# Patient Record
Sex: Male | Born: 1975 | Race: Black or African American | Hispanic: No | Marital: Single | Smoking: Current every day smoker
Health system: Southern US, Community
[De-identification: ages and names within clinical notes are randomized; demographics above are authoritative.]

## PROBLEM LIST (undated history)

## (undated) ENCOUNTER — Emergency Department (HOSPITAL_COMMUNITY): Admission: EM | Payer: Self-pay | Source: Home / Self Care

## (undated) DIAGNOSIS — R569 Unspecified convulsions: Secondary | ICD-10-CM

## (undated) DIAGNOSIS — W3400XA Accidental discharge from unspecified firearms or gun, initial encounter: Secondary | ICD-10-CM

## (undated) DIAGNOSIS — Y249XXA Unspecified firearm discharge, undetermined intent, initial encounter: Secondary | ICD-10-CM

## (undated) HISTORY — PX: ABDOMINAL SURGERY: SHX537

---

## 2013-02-03 ENCOUNTER — Emergency Department (HOSPITAL_COMMUNITY): Payer: Self-pay

## 2013-02-03 ENCOUNTER — Emergency Department (HOSPITAL_COMMUNITY)
Admission: EM | Admit: 2013-02-03 | Discharge: 2013-02-03 | Disposition: A | Discharge status: Home / Self Care | Payer: Self-pay | Attending: Emergency Medicine | Admitting: Emergency Medicine

## 2013-02-03 ENCOUNTER — Encounter (HOSPITAL_COMMUNITY): Payer: Self-pay | Admitting: Emergency Medicine

## 2013-02-03 DIAGNOSIS — R1031 Right lower quadrant pain: Secondary | ICD-10-CM | POA: Insufficient documentation

## 2013-02-03 DIAGNOSIS — F17200 Nicotine dependence, unspecified, uncomplicated: Secondary | ICD-10-CM | POA: Insufficient documentation

## 2013-02-03 DIAGNOSIS — Z9889 Other specified postprocedural states: Secondary | ICD-10-CM | POA: Insufficient documentation

## 2013-02-03 DIAGNOSIS — R109 Unspecified abdominal pain: Secondary | ICD-10-CM

## 2013-02-03 LAB — POCT I-STAT, CHEM 8
BUN: 14 mg/dL (ref 6–23)
Calcium, Ion: 1.22 mmol/L (ref 1.12–1.23)
Chloride: 104 mEq/L (ref 96–112)
Creatinine, Ser: 1 mg/dL (ref 0.50–1.35)
Glucose, Bld: 135 mg/dL — ABNORMAL HIGH (ref 70–99)
HCT: 53 % — ABNORMAL HIGH (ref 39.0–52.0)
Hemoglobin: 18 g/dL — ABNORMAL HIGH (ref 13.0–17.0)
Potassium: 4.6 mEq/L (ref 3.5–5.1)
Sodium: 138 mEq/L (ref 135–145)
TCO2: 28 mmol/L (ref 0–100)

## 2013-02-03 LAB — CBC WITH DIFFERENTIAL/PLATELET
Basophils Absolute: 0 10*3/uL (ref 0.0–0.1)
Basophils Relative: 0 % (ref 0–1)
Eosinophils Absolute: 0.1 10*3/uL (ref 0.0–0.7)
Eosinophils Relative: 1 % (ref 0–5)
HCT: 47.8 % (ref 39.0–52.0)
Hemoglobin: 17.1 g/dL — ABNORMAL HIGH (ref 13.0–17.0)
Lymphocytes Relative: 10 % — ABNORMAL LOW (ref 12–46)
Lymphs Abs: 1.4 10*3/uL (ref 0.7–4.0)
MCH: 29.5 pg (ref 26.0–34.0)
MCHC: 35.8 g/dL (ref 30.0–36.0)
MCV: 82.6 fL (ref 78.0–100.0)
Monocytes Absolute: 0.6 10*3/uL (ref 0.1–1.0)
Monocytes Relative: 4 % (ref 3–12)
Neutro Abs: 13 10*3/uL — ABNORMAL HIGH (ref 1.7–7.7)
Neutrophils Relative %: 86 % — ABNORMAL HIGH (ref 43–77)
Platelets: 221 10*3/uL (ref 150–400)
RBC: 5.79 MIL/uL (ref 4.22–5.81)
RDW: 12.8 % (ref 11.5–15.5)
WBC: 15.2 10*3/uL — ABNORMAL HIGH (ref 4.0–10.5)

## 2013-02-03 LAB — URINALYSIS, ROUTINE W REFLEX MICROSCOPIC
Bilirubin Urine: NEGATIVE
Glucose, UA: NEGATIVE mg/dL
Hgb urine dipstick: NEGATIVE
Ketones, ur: 15 mg/dL — AB
Leukocytes, UA: NEGATIVE
Nitrite: NEGATIVE
Protein, ur: NEGATIVE mg/dL
Specific Gravity, Urine: 1.034 — ABNORMAL HIGH (ref 1.005–1.030)
Urobilinogen, UA: 0.2 mg/dL (ref 0.0–1.0)
pH: 7 (ref 5.0–8.0)

## 2013-02-03 LAB — LIPASE, BLOOD: Lipase: 21 U/L (ref 11–59)

## 2013-02-03 MED ORDER — IOHEXOL 300 MG/ML  SOLN
100.0000 mL | Freq: Once | INTRAMUSCULAR | Status: AC | PRN
  Administered 2013-02-03: 100 mL via INTRAVENOUS

## 2013-02-03 MED ORDER — ONDANSETRON HCL 4 MG/2ML IJ SOLN
4.0000 mg | Freq: Once | INTRAMUSCULAR | Status: AC
  Administered 2013-02-03: 4 mg via INTRAVENOUS
  Filled 2013-02-03: qty 2

## 2013-02-03 MED ORDER — HYDROMORPHONE HCL PF 1 MG/ML IJ SOLN
1.0000 mg | Freq: Once | INTRAMUSCULAR | Status: AC
  Administered 2013-02-03: 1 mg via INTRAVENOUS
  Filled 2013-02-03: qty 1

## 2013-02-03 MED ORDER — IOHEXOL 300 MG/ML  SOLN
50.0000 mL | Freq: Once | INTRAMUSCULAR | Status: AC | PRN
  Administered 2013-02-03: 50 mL via ORAL

## 2013-02-03 MED ORDER — ONDANSETRON HCL 4 MG PO TABS: 4.0000 mg | ORAL_TABLET | Freq: Four times a day (QID) | ORAL | Status: DC

## 2013-02-03 MED ORDER — SODIUM CHLORIDE 0.9 % IV BOLUS (SEPSIS)
1000.0000 mL | Freq: Once | INTRAVENOUS | Status: AC
  Administered 2013-02-03: 1000 mL via INTRAVENOUS

## 2013-02-03 MED ORDER — OXYCODONE-ACETAMINOPHEN 5-325 MG PO TABS: 1.0000 | ORAL_TABLET | ORAL | Status: DC | PRN

## 2013-02-03 NOTE — ED Notes (Signed)
Pt presents to department for evaluation of diffuse abdominal pain and tenderness to RLQ. Onset last night around 9pm. 6/10 pain at the time. Also states nausea/vomiting. Denies urinary symptoms. Last BM yesterday was normal. Pt is alert and oriented x4.
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
## 2013-02-03 NOTE — ED Notes (Signed)
Pt c/o sharp upper abdominal pain onset 2100. Pt also c/o N/V. Last normal Bowel movement was this morning.

## 2013-05-31 ENCOUNTER — Emergency Department (HOSPITAL_COMMUNITY)
Admission: EM | Admit: 2013-05-31 | Discharge: 2013-05-31 | Disposition: A | Discharge status: Home / Self Care | Payer: Self-pay | Attending: Emergency Medicine | Admitting: Emergency Medicine

## 2013-05-31 ENCOUNTER — Encounter (HOSPITAL_COMMUNITY): Payer: Self-pay | Admitting: Family Medicine

## 2013-05-31 DIAGNOSIS — IMO0002 Reserved for concepts with insufficient information to code with codable children: Secondary | ICD-10-CM | POA: Insufficient documentation

## 2013-05-31 DIAGNOSIS — L0291 Cutaneous abscess, unspecified: Secondary | ICD-10-CM

## 2013-05-31 DIAGNOSIS — F17200 Nicotine dependence, unspecified, uncomplicated: Secondary | ICD-10-CM | POA: Insufficient documentation

## 2013-05-31 MED ORDER — SULFAMETHOXAZOLE-TRIMETHOPRIM 800-160 MG PO TABS: 1.0000 | ORAL_TABLET | Freq: Two times a day (BID) | ORAL | Status: DC

## 2013-05-31 MED ORDER — HYDROCODONE-ACETAMINOPHEN 5-325 MG PO TABS: 1.0000 | ORAL_TABLET | ORAL | Status: DC | PRN

## 2013-05-31 NOTE — ED Notes (Signed)
Per pt sts abscess in right axilla area. sts swollen and draining. Hx of same.

## 2013-05-31 NOTE — ED Notes (Addendum)
Pt reports abscess under right arm.  States he has noticed it for a couple months and in the last 3 days the abscess has gotten larger, more red and painful.  Pt alert oriented X4

## 2013-05-31 NOTE — ED Provider Notes (Signed)
   History    CSN: [id number] Arrival date & time 05/31/13  1344  First MD Initiated Contact with Patient 05/31/13 1439     Chief Complaint  Patient presents with  . Abscess   (Consider location/radiation/quality/duration/timing/severity/associated sxs/prior Treatment) HPI  Patient presents to the ED for right axilla x 5 days. Patient states initially it started out as a very small "knot" but has increased in size over the past 2 days and is now very painful. There was initially some purulent drainage but none today.  No recent fevers, sweats, or chills.  Hx of similar abscess requiring I&D.  No medications taken PTA.  History reviewed. No pertinent past medical history. Past Surgical History  Procedure Laterality Date  . Abdominal surgery     History reviewed. No pertinent family history. History  Substance Use Topics  . Smoking status: Current Every Day Smoker -- 0.50 packs/day  . Smokeless tobacco: Not on file  . Alcohol Use: No     Comment: Quit Feb 2014    Review of Systems  Skin:       abscess  All other systems reviewed and are negative.    Allergies  Review of patient's allergies indicates no known allergies.  Home Medications  No current outpatient prescriptions on file. BP 145/92  Pulse 105  Temp(Src) 98.3 F (36.8 C)  Resp 18  SpO2 98%  Physical Exam  Nursing note and vitals reviewed. Constitutional: He is oriented to person, place, and time. He appears well-developed and well-nourished.  HENT:  Head: Normocephalic and atraumatic.  Eyes: Conjunctivae and EOM are normal.  Neck: Normal range of motion. Neck supple.  Cardiovascular: Normal rate, regular rhythm and normal heart sounds.   Pulmonary/Chest: Effort normal and breath sounds normal.  Musculoskeletal: Normal range of motion.  Neurological: He is alert and oriented to person, place, and time.  Skin: Skin is warm and dry.  2 cm abscess of right axilla, nondraining, central fluctuance, no  surrounding erythema, induration, or signs of cellulitis  Psychiatric: He has a normal mood and affect.    ED Course  Procedures (including critical care time)  INCISION AND DRAINAGE Performed by: PA Student Delon Sacramento under my direct supervision. Consent: Verbal consent obtained. Risks and benefits: risks, benefits and alternatives were discussed Type: abscess  Body area: right axilla  Anesthesia: local infiltration  Incision was made with a scalpel.  Local anesthetic: lidocaine 2% without epinephrine  Anesthetic total: 2 ml  Complexity: complex Blunt dissection to break up loculations  Drainage: purulent  Drainage amount: large  Packing material: none  Patient tolerance: Patient tolerated the procedure well with no immediate complications.     Labs Reviewed - No data to display No results found. 1. Abscess     MDM   ID as above, patient tolerated well.  Instructed on home wound care and monitoring for signs of infection that would warrant return to the ED.  Rx Bactrim and Vicodin.  Discussed plan with patient, he agreed. Return precautions advised.  Garlon Hatchet, PA-C 05/31/13 [phone]

## 2013-06-03 NOTE — ED Provider Notes (Signed)
Medical screening examination/treatment/procedure(s) were performed by non-physician practitioner and as supervising physician I was immediately available for consultation/collaboration.  Caelin Rosen R. Anjel Pardo, MD 06/03/13 1502 

## 2014-12-23 ENCOUNTER — Telehealth: Payer: Self-pay | Admitting: *Deleted

## 2014-12-23 ENCOUNTER — Encounter (HOSPITAL_COMMUNITY): Payer: Self-pay | Admitting: Neurology

## 2014-12-23 ENCOUNTER — Emergency Department (HOSPITAL_COMMUNITY)
Admission: EM | Admit: 2014-12-23 | Discharge: 2014-12-23 | Disposition: A | Discharge status: Home / Self Care | Payer: Self-pay | Attending: Emergency Medicine | Admitting: Emergency Medicine

## 2014-12-23 DIAGNOSIS — K611 Rectal abscess: Secondary | ICD-10-CM | POA: Insufficient documentation

## 2014-12-23 DIAGNOSIS — Z720 Tobacco use: Secondary | ICD-10-CM | POA: Insufficient documentation

## 2014-12-23 MED ORDER — HYDROCODONE-ACETAMINOPHEN 5-325 MG PO TABS: 2.0000 | ORAL_TABLET | ORAL | Status: DC | PRN

## 2014-12-23 MED ORDER — CEPHALEXIN 250 MG PO CAPS
500.0000 mg | ORAL_CAPSULE | Freq: Once | ORAL | Status: AC
  Administered 2014-12-23: 500 mg via ORAL
  Filled 2014-12-23: qty 2

## 2014-12-23 MED ORDER — SULFAMETHOXAZOLE-TRIMETHOPRIM 800-160 MG PO TABS: 1.0000 | ORAL_TABLET | Freq: Two times a day (BID) | ORAL | Status: DC

## 2014-12-23 MED ORDER — HYDROCODONE-ACETAMINOPHEN 5-325 MG PO TABS
2.0000 | ORAL_TABLET | Freq: Once | ORAL | Status: AC
  Administered 2014-12-23: 2 via ORAL
  Filled 2014-12-23: qty 2

## 2014-12-23 NOTE — ED Notes (Signed)
Pt has abscess to right inside of buttocks, has been trying to pop but unable.

## 2014-12-23 NOTE — Telephone Encounter (Signed)
Opened by mistake.

## 2014-12-23 NOTE — Progress Notes (Signed)
Christopher Pierron J. Clydene Laming, RN, [place], [place] [phone] ED CM consulted to meet with patient concerning f/u care with PCP and patient does not have insurance. Pt presented to [facility] ED today with back pain.  Met with patient at bedside, confirmed informaton. Pt  reports not having access to f/u care with PCP, or insurance coverage. Discussed with patient importance and benefits of  establishing PCP, and not utilizing the ED for primary care needs. Pt verbalized understanding and is in agreement. Discussed other options, provided list of local  affordable PCPs.  Pt voiced  Interest in the [facility] and [place].  [facility] Brochure given with address, phone number, and the services highlighted. Explained that there is a Customer service manager on site who will assist with [employer] and process. Instructed to call or walk in TODAY before 5:30p to schedule appt for establishing care for PCP. Pt verbalized understanding.   NCM called [facility] to alert them of pt walking over.

## 2014-12-23 NOTE — Discharge Instructions (Signed)
Take bactrim as directed until gone. Take vicodin as needed for pain. Refer to attached documents for more information. Follow up with the recommended general surgeon as needed. Return to the ED with worsening or concerning symptoms.

## 2014-12-23 NOTE — ED Provider Notes (Signed)
CSN: [id number]     Arrival date & time 12/23/14  1150 History  This chart was scribed for non-physician practitioner, Emilia Beck, PA-C, working with Toy Baker, MD by Charline Bills, ED Scribe. This patient was seen in room TR11C/TR11C and the patient's care was started at 1:15 PM.   Chief Complaint  Patient presents with  . Abscess   The history is provided by the patient. No language interpreter was used.   HPI Comments: Christopher Moon is a 39 y.o. male who presents to the Emergency Department complaining of gradually worsening abscess to R buttock first noted 2 days ago. Pain is exacerbated with walking, bending and BMs. Pt reports associated nausea onset earlier today that has resolved. He denies abdominal pain, vomiting. No h/o hemorrhoids.   History reviewed. No pertinent past medical history. Past Surgical History  Procedure Laterality Date  . Abdominal surgery     No family history on file. History  Substance Use Topics  . Smoking status: Current Every Day Smoker -- 0.50 packs/day  . Smokeless tobacco: Not on file  . Alcohol Use: Yes     Comment: Quit Feb 2014    Review of Systems  Gastrointestinal: Negative for vomiting and abdominal pain. Nausea: resolved.  Skin: Positive for wound.       + Abscess  All other systems reviewed and are negative.  Allergies  Review of patient's allergies indicates no known allergies.  Home Medications   Prior to Admission medications   Medication Sig Start Date End Date Taking? Authorizing Provider  HYDROcodone-acetaminophen (NORCO/VICODIN) 5-325 MG per tablet Take 1 tablet by mouth every 4 (four) hours as needed for pain. 05/31/13   Garlon Hatchet, PA-C  sulfamethoxazole-trimethoprim (SEPTRA DS) 800-160 MG per tablet Take 1 tablet by mouth every 12 (twelve) hours. 05/31/13   Garlon Hatchet, PA-C   Triage Vitals: BP 127/83 mmHg  Pulse 90  Temp(Src) 97.9 F (36.6 C) (Oral)  Resp 18  SpO2 98% Physical Exam   Constitutional: He is oriented to person, place, and time. He appears well-developed and well-nourished. No distress.  HENT:  Head: Normocephalic and atraumatic.  Eyes: Conjunctivae and EOM are normal.  Neck: Neck supple.  Cardiovascular: Normal rate and regular rhythm.  Exam reveals no gallop and no friction rub.   No murmur heard. Pulmonary/Chest: Effort normal and breath sounds normal. He has no wheezes. He has no rales. He exhibits no tenderness.  Abdominal: Soft. There is no tenderness.  Genitourinary:  2 x 2 cm perirectal abscess at the 10 o'clock position. The area is tender to palpate with no drainage. Internal rectal exam unremarkable for tracking infection.   Musculoskeletal: Normal range of motion.  Neurological: He is alert and oriented to person, place, and time.  Speech is goal-oriented. Moves limbs without ataxia.   Skin: Skin is warm and dry.  Psychiatric: He has a normal mood and affect. His behavior is normal.  Nursing note and vitals reviewed.  ED Course  Procedures (including critical care time) DIAGNOSTIC STUDIES: Oxygen Saturation is 98% on RA, normal by my interpretation.    COORDINATION OF CARE: 1:18 PM-Discussed treatment plan which includes I&D with pt at bedside and pt agreed to plan.   INCISION AND DRAINAGE PROCEDURE NOTE: Patient identification was confirmed and verbal consent was obtained. This procedure was performed by Emilia Beck, PA-C at 1:30 PM. Site: Perirectal  Sterile procedures observed Anesthetic used (type and amt): topical Blade size: 11 Drainage: copious, purulent Complexity: Complex Site  anesthetized, incision made over site, wound drained and explored loculations, rinsed with copious amounts of normal saline, covered with dry, sterile dressing.  Pt tolerated procedure well without complications.  Instructions for care discussed verbally and pt provided with additional written instructions for homecare and f/u.  Labs  Review Labs Reviewed - No data to display  Imaging Review No results found.   EKG Interpretation None      MDM   Final diagnoses:  Perirectal abscess    1:50 PM Abscess drained without complication. Patient will have antibiotics and pain medication. Patient advised to follow up with General Surgery. Vitals stable and patient afebrile.   I personally performed the services described in this documentation, which was scribed in my presence. The recorded information has been reviewed and is accurate.    Emilia BeckKaitlyn Owyn Raulston, PA-C 12/23/14 1404  Toy BakerAnthony T Allen, MD 12/24/14 1004

## 2015-02-11 ENCOUNTER — Emergency Department (HOSPITAL_COMMUNITY)
Admission: EM | Admit: 2015-02-11 | Discharge: 2015-02-11 | Disposition: A | Discharge status: Home / Self Care | Payer: Self-pay | Attending: Emergency Medicine | Admitting: Emergency Medicine

## 2015-02-11 ENCOUNTER — Encounter (HOSPITAL_COMMUNITY): Payer: Self-pay | Admitting: Family Medicine

## 2015-02-11 DIAGNOSIS — L0390 Cellulitis, unspecified: Secondary | ICD-10-CM

## 2015-02-11 DIAGNOSIS — Z720 Tobacco use: Secondary | ICD-10-CM | POA: Insufficient documentation

## 2015-02-11 DIAGNOSIS — L0291 Cutaneous abscess, unspecified: Secondary | ICD-10-CM

## 2015-02-11 DIAGNOSIS — K611 Rectal abscess: Secondary | ICD-10-CM | POA: Insufficient documentation

## 2015-02-11 MED ORDER — LIDOCAINE-EPINEPHRINE (PF) 2 %-1:200000 IJ SOLN
10.0000 mL | Freq: Once | INTRAMUSCULAR | Status: AC
  Administered 2015-02-11: 10 mL
  Filled 2015-02-11: qty 20

## 2015-02-11 MED ORDER — CEPHALEXIN 500 MG PO CAPS: 500.0000 mg | ORAL_CAPSULE | Freq: Four times a day (QID) | ORAL | Status: DC

## 2015-02-11 MED ORDER — SULFAMETHOXAZOLE-TRIMETHOPRIM 800-160 MG PO TABS: 1.0000 | ORAL_TABLET | Freq: Two times a day (BID) | ORAL | Status: DC

## 2015-02-11 NOTE — ED Notes (Signed)
Pt states possible abscess to buttock area. Reports issues with same in the past. States small raised and swollen area between buttocks, no drainage noted. 6/10 pain upon arrival to ED. No signs of acute distress noted.

## 2015-02-11 NOTE — ED Notes (Signed)
MD and PA at bedside.  

## 2015-02-11 NOTE — ED Provider Notes (Signed)
CSN: [id number]     Arrival date & time 02/11/15  4098 History   First MD Initiated Contact with Patient 02/11/15 [phone]     Chief Complaint  Patient presents with  . Rectal Problems     (Consider location/radiation/quality/duration/timing/severity/associated sxs/prior Treatment) HPI Comments: 39 y.o. male presenting with cc of rectal pain. Pt has PMH of perirectal abscess in January for which he was treated with I&D and Bactrim. Pt states that his pain is similar to the pain he experienced in January. Pt states that his rectal pain worsens with sitting, walking, and BMs. Pt reports having regular BMs with no hematochezia or mucous in the stool. Pt reports normal urinary habits. Pt denies fever, chills, N/V/D, palpitations, or diaphoresis.   The history is provided by the patient.    History reviewed. No pertinent past medical history. Past Surgical History  Procedure Laterality Date  . Abdominal surgery     History reviewed. No pertinent family history. History  Substance Use Topics  . Smoking status: Current Every Day Smoker -- 0.50 packs/day  . Smokeless tobacco: Not on file  . Alcohol Use: Yes     Comment: Quit Feb 2014    Review of Systems  Constitutional: Negative for activity change and appetite change.  Respiratory: Negative for cough and shortness of breath.   Cardiovascular: Negative for chest pain.  Gastrointestinal: Negative for abdominal pain.  Genitourinary: Negative for dysuria.  Skin: Positive for rash.      Allergies  Review of patient's allergies indicates no known allergies.  Home Medications   Prior to Admission medications   Medication Sig Start Date End Date Taking? Authorizing Provider  cephALEXin (KEFLEX) 500 MG capsule Take 1 capsule (500 mg total) by mouth 4 (four) times daily. 02/11/15   Derwood Kaplan, MD  HYDROcodone-acetaminophen (NORCO/VICODIN) 5-325 MG per tablet Take 2 tablets by mouth every 4 (four) hours as needed for moderate pain or  severe pain. Patient not taking: Reported on 02/11/2015 12/23/14   Emilia Beck, PA-C  sulfamethoxazole-trimethoprim (SEPTRA DS) 800-160 MG per tablet Take 1 tablet by mouth 2 (two) times daily. 02/11/15   Geralda Baumgardner Rhunette Croft, MD   BP 110/94 mmHg  Pulse 68  Temp(Src) 98.7 F (37.1 C)  Resp 18  Ht  (1.676 m)  Wt 165 lb (74.844 kg)  BMI 26.64 kg/m2  SpO2 97% Physical Exam  Constitutional: He is oriented to person, place, and time. He appears well-developed.  HENT:  Head: Normocephalic and atraumatic.  Eyes: Conjunctivae and EOM are normal. Pupils are equal, round, and reactive to light.  Neck: Normal range of motion. Neck supple.  Cardiovascular: Normal rate and regular rhythm.   Pulmonary/Chest: Effort normal and breath sounds normal.  Abdominal: Soft. Bowel sounds are normal. He exhibits no distension. There is no tenderness. There is no rebound and no guarding.  Right gluteal fold, lateral to the anal sphincter - there is a 3 cm lesion with fluctuance and surrounding erythema and induration.  Neurological: He is alert and oriented to person, place, and time.  Skin: Skin is warm.  Nursing note and vitals reviewed.   ED Course  Procedures (including critical care time) Labs Review Labs Reviewed - No data to display  Imaging Review No results found.   EKG Interpretation None      INCISION AND DRAINAGE Performed by: Derwood Kaplan Consent: Verbal consent obtained. Risks and benefits: risks, benefits and alternatives were discussed Type: abscess  Body area: Gluteal, perirectal  Anesthesia: local infiltration  Incision was made with a scalpel.  Local anesthetic: lidocaine 1 % with epinephrine  Anesthetic total: 3 ml  Complexity: complex Blunt dissection to break up loculations  Drainage: purulent  Drainage amount: moderate  Packing material: 1/4 in iodoform gauze  Patient tolerance: Patient tolerated the procedure well with no immediate  complications.     MDM   Final diagnoses:  Abscess and cellulitis   Pt comes in with cc of rectal pain. Has a perirectal abscess - no sphincter involvement. Abscess drained. There is surrounding induration and erythema, will start on antibiotics.    Derwood KaplanAnkit Dera Vanaken, MD 02/11/15 [id number]

## 2015-02-11 NOTE — Discharge Instructions (Signed)
We saw you in the ER for the abscess. We drained the abscess, and have packed the wound. Keep the packing in there for 2 days, and see a primary care doctor or return to the ER for the packing removal and wound recheck.  IF YOU HAVE HAD HISTORY OF SUCH INFECTIONS AND FEEL THAT THE WOUND IS HEALING WELL, YOU MAY TAKE OUT THE PACKING YOURSELF AFTER 2-3 DYS IF THERE IS NO MORE DRAINAGE.  Keep the wound clean and dry otherwise, and allow for any drainage to occur.   Abscess An abscess is an infected area that contains a collection of pus and debris.It can occur in almost any part of the body. An abscess is also known as a furuncle or boil. CAUSES  An abscess occurs when tissue gets infected. This can occur from blockage of oil or sweat glands, infection of hair follicles, or a minor injury to the skin. As the body tries to fight the infection, pus collects in the area and creates pressure under the skin. This pressure causes pain. People with weakened immune systems have difficulty fighting infections and get certain abscesses more often.  SYMPTOMS Usually an abscess develops on the skin and becomes a painful mass that is red, warm, and tender. If the abscess forms under the skin, you may feel a moveable soft area under the skin. Some abscesses break open (rupture) on their own, but most will continue to get worse without care. The infection can spread deeper into the body and eventually into the bloodstream, causing you to feel ill.  DIAGNOSIS  Your caregiver will take your medical history and perform a physical exam. A sample of fluid may also be taken from the abscess to determine what is causing your infection. TREATMENT  Your caregiver may prescribe antibiotic medicines to fight the infection. However, taking antibiotics alone usually does not cure an abscess. Your caregiver may need to make a small cut (incision) in the abscess to drain the pus. In some cases, gauze is packed into the abscess to  reduce pain and to continue draining the area. HOME CARE INSTRUCTIONS   Only take over-the-counter or prescription medicines for pain, discomfort, or fever as directed by your caregiver.  If you were prescribed antibiotics, take them as directed. Finish them even if you start to feel better.  If gauze is used, follow your caregiver's directions for changing the gauze.  To avoid spreading the infection:  Keep your draining abscess covered with a bandage.  Wash your hands well.  Do not share personal care items, towels, or whirlpools with others.  Avoid skin contact with others.  Keep your skin and clothes clean around the abscess.  Keep all follow-up appointments as directed by your caregiver. SEEK MEDICAL CARE IF:   You have increased pain, swelling, redness, fluid drainage, or bleeding.  You have muscle aches, chills, or a general ill feeling.  You have a fever. MAKE SURE YOU:   Understand these instructions.  Will watch your condition.  Will get help right away if you are not doing well or get worse. Document Released: 08/31/2005 Document Revised: 05/22/2012 Document Reviewed: 02/03/2012 [facility] Patient Information 2015 [place], [place]. This information is not intended to replace advice given to you by your health care provider. Make sure you discuss any questions you have with your health care provider.  Cellulitis Cellulitis is an infection of the skin and the tissue beneath it. The infected area is usually red and tender. Cellulitis occurs most often  in the arms and lower legs.  CAUSES  Cellulitis is caused by bacteria that enter the skin through cracks or cuts in the skin. The most common types of bacteria that cause cellulitis are staphylococci and streptococci. SIGNS AND SYMPTOMS   Redness and warmth.  Swelling.  Tenderness or pain.  Fever. DIAGNOSIS  Your health care provider can usually determine what is wrong based on a physical exam. Blood tests may  also be done. TREATMENT  Treatment usually involves taking an antibiotic medicine. HOME CARE INSTRUCTIONS   Take your antibiotic medicine as directed by your health care provider. Finish the antibiotic even if you start to feel better.  Keep the infected arm or leg elevated to reduce swelling.  Apply a warm cloth to the affected area up to 4 times per day to relieve pain.  Take medicines only as directed by your health care provider.  Keep all follow-up visits as directed by your health care provider. SEEK MEDICAL CARE IF:   You notice red streaks coming from the infected area.  Your red area gets larger or turns dark in color.  Your bone or joint underneath the infected area becomes painful after the skin has healed.  Your infection returns in the same area or another area.  You notice a swollen bump in the infected area.  You develop new symptoms.  You have a fever. SEEK IMMEDIATE MEDICAL CARE IF:   You feel very sleepy.  You develop vomiting or diarrhea.  You have a general ill feeling (malaise) with muscle aches and pains. MAKE SURE YOU:   Understand these instructions.  Will watch your condition.  Will get help right away if you are not doing well or get worse. Document Released: 08/31/2005 Document Revised: 04/07/2014 Document Reviewed: 02/06/2012 [facility] Patient Information 2015 [place], [place]. This information is not intended to replace advice given to you by your health care provider. Make sure you discuss any questions you have with your health care provider.  Incision and Drainage Incision and drainage is a procedure in which a sac-like structure (cystic structure) is opened and drained. The area to be drained usually contains material such as pus, fluid, or blood.  LET YOUR CAREGIVER KNOW ABOUT:   Allergies to medicine.  Medicines taken, including vitamins, herbs, eyedrops, over-the-counter medicines, and creams.  Use of steroids (by mouth or  creams).  Previous problems with anesthetics or numbing medicines.  History of bleeding problems or blood clots.  Previous surgery.  Other health problems, including diabetes and kidney problems.  Possibility of pregnancy, if this applies. RISKS AND COMPLICATIONS  Pain.  Bleeding.  Scarring.  Infection. BEFORE THE PROCEDURE  You may need to have an ultrasound or other imaging tests to see how large or deep your cystic structure is. Blood tests may also be used to determine if you have an infection or how severe the infection is. You may need to have a tetanus shot. PROCEDURE  The affected area is cleaned with a cleaning fluid. The cyst area will then be numbed with a medicine (local anesthetic). A small incision will be made in the cystic structure. A syringe or catheter may be used to drain the contents of the cystic structure, or the contents may be squeezed out. The area will then be flushed with a cleansing solution. After cleansing the area, it is often gently packed with a gauze or another wound dressing. Once it is packed, it will be covered with gauze and tape or some other  type of wound dressing. AFTER THE PROCEDURE   Often, you will be allowed to go home right after the procedure.  You may be given antibiotic medicine to prevent or heal an infection.  If the area was packed with gauze or some other wound dressing, you will likely need to come back in 1 to 2 days to get it removed.  The area should heal in about 14 days. Document Released: 05/17/2001 Document Revised: 05/22/2012 Document Reviewed: 01/16/2012 [facility] Patient Information 2015 [place]. This information is not intended to replace advice given to you by your health care provider. Make sure you discuss any questions you have with your health care provider.

## 2015-02-11 NOTE — ED Notes (Signed)
Pt here for abscess to rectal area. sts hx of same in January. Denies drainage. sts x 2 days.

## 2015-02-13 ENCOUNTER — Ambulatory Visit: Payer: Self-pay

## 2015-07-03 ENCOUNTER — Encounter (HOSPITAL_COMMUNITY): Payer: Self-pay | Admitting: Emergency Medicine

## 2015-07-03 ENCOUNTER — Emergency Department (HOSPITAL_COMMUNITY): Payer: Worker's Compensation

## 2015-07-03 ENCOUNTER — Emergency Department (HOSPITAL_COMMUNITY)
Admission: EM | Admit: 2015-07-03 | Discharge: 2015-07-03 | Disposition: A | Discharge status: Home / Self Care | Payer: Worker's Compensation | Attending: Emergency Medicine | Admitting: Emergency Medicine

## 2015-07-03 DIAGNOSIS — Y9389 Activity, other specified: Secondary | ICD-10-CM | POA: Insufficient documentation

## 2015-07-03 DIAGNOSIS — Y92000 Kitchen of unspecified non-institutional (private) residence as  the place of occurrence of the external cause: Secondary | ICD-10-CM | POA: Diagnosis not present

## 2015-07-03 DIAGNOSIS — Z720 Tobacco use: Secondary | ICD-10-CM | POA: Diagnosis not present

## 2015-07-03 DIAGNOSIS — Z23 Encounter for immunization: Secondary | ICD-10-CM | POA: Insufficient documentation

## 2015-07-03 DIAGNOSIS — Z792 Long term (current) use of antibiotics: Secondary | ICD-10-CM | POA: Diagnosis not present

## 2015-07-03 DIAGNOSIS — S61317A Laceration without foreign body of left little finger with damage to nail, initial encounter: Secondary | ICD-10-CM | POA: Diagnosis not present

## 2015-07-03 DIAGNOSIS — W260XXA Contact with knife, initial encounter: Secondary | ICD-10-CM | POA: Insufficient documentation

## 2015-07-03 DIAGNOSIS — IMO0002 Reserved for concepts with insufficient information to code with codable children: Secondary | ICD-10-CM

## 2015-07-03 DIAGNOSIS — S61219A Laceration without foreign body of unspecified finger without damage to nail, initial encounter: Secondary | ICD-10-CM

## 2015-07-03 DIAGNOSIS — Y998 Other external cause status: Secondary | ICD-10-CM | POA: Diagnosis not present

## 2015-07-03 DIAGNOSIS — S6992XA Unspecified injury of left wrist, hand and finger(s), initial encounter: Secondary | ICD-10-CM | POA: Diagnosis present

## 2015-07-03 MED ORDER — TETANUS-DIPHTH-ACELL PERTUSSIS 5-2.5-18.5 LF-MCG/0.5 IM SUSP
0.5000 mL | Freq: Once | INTRAMUSCULAR | Status: AC
  Administered 2015-07-03: 0.5 mL via INTRAMUSCULAR
  Filled 2015-07-03: qty 0.5

## 2015-07-03 MED ORDER — LIDOCAINE HCL 2 % IJ SOLN
10.0000 mL | Freq: Once | INTRAMUSCULAR | Status: AC
  Administered 2015-07-03: 200 mg
  Filled 2015-07-03: qty 20

## 2015-07-03 MED ORDER — MORPHINE SULFATE 4 MG/ML IJ SOLN
4.0000 mg | Freq: Once | INTRAMUSCULAR | Status: AC
  Administered 2015-07-03: 4 mg via INTRAMUSCULAR
  Filled 2015-07-03: qty 1

## 2015-07-03 NOTE — ED Provider Notes (Signed)
CSN: [id number]     Arrival date & time 07/03/15  1904 History  This chart was scribed for non-physician practitioner Elpidio Anis, PA-C working with Zadie Rhine, MD by Lyndel Safe, ED Scribe. This patient was seen in room WTR5/WTR5 and the patient's care was started at 8:02 PM.  Chief Complaint  Patient presents with  . Extremity Laceration    left pinky finger   The history is provided by the patient. No language interpreter was used.   HPI Comments: Christopher Moon is a 39 y.o. male, with no pertinent PMhx, who presents to the Emergency Department complaining of a laceration above the DIP joint of his left, 5th digit that occurred 30 minutes pta. 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 Pt notes the laceration is almost completely through the tip of his finger. He is right handed. Last tetanus injection unknown. Denies fevers.   History reviewed. No pertinent past medical history. Past Surgical History  Procedure Laterality Date  . Abdominal surgery     History reviewed. No pertinent family history. History  Substance Use Topics  . Smoking status: Current Every Day Smoker -- 0.50 packs/day  . Smokeless tobacco: Not on file  . Alcohol Use: Yes     Comment: Quit Feb 2014    Review of Systems  Constitutional: Negative for fever.  Skin: Positive for wound.    Allergies  Review of patient's allergies indicates no known allergies.  Home Medications   Prior to Admission medications   Medication Sig Start Date End Date Taking? Authorizing Provider  cephALEXin (KEFLEX) 500 MG capsule Take 1 capsule (500 mg total) by mouth 4 (four) times daily. 02/11/15   Derwood Kaplan, MD  HYDROcodone-acetaminophen (NORCO/VICODIN) 5-325 MG per tablet Take 2 tablets by mouth every 4 (four) hours as needed for moderate pain or severe pain. Patient not taking: Reported on 02/11/2015 12/23/14   Emilia Beck, PA-C  sulfamethoxazole-trimethoprim (SEPTRA DS)  800-160 MG per tablet Take 1 tablet by mouth 2 (two) times daily. 02/11/15   Ankit Nanavati, MD   BP 135/100 mmHg  Pulse 85  Temp(Src) 98.2 F (36.8 C) (Oral)  Resp 17  SpO2 99% Physical Exam  Constitutional: He appears well-developed and well-nourished. No distress.  HENT:  Head: Normocephalic.  Neck: No JVD present.  Pulmonary/Chest: Effort normal. No respiratory distress.  Musculoskeletal:  Left 5th finger flap laceration to tip involving distal 1/4 nail. No bone exposure.   Neurological: He is alert.  Skin: Skin is warm. No rash noted. No erythema. No pallor.  Psychiatric: He has a normal mood and affect. His behavior is normal.  Nursing note and vitals reviewed.   ED Course  Procedures  DIAGNOSTIC STUDIES: Oxygen Saturation is 99% on RA, normal  by my interpretation.    COORDINATION OF CARE: 8:04 PM Discussed treatment plan which includes to perform laceration repair with pt. Will order morphine 4mg  injection. Pt acknowledges and agrees to plan.  LACERATION REPAIR PROCEDURE NOTE The patient's identification was confirmed and consent was obtained. This procedure was performed by Elpidio Anis, PA-C at 8:40 PM. Site: left 5th finger Sterile procedures observed Anesthetic used (type and amt): Digital block using lidocaine 2% without epinephrine  Suture type/size:4-0 prolene Length:1.5 cm # of Sutures: 3 Technique: simple interrupted Complexitysimple Antibx ointment applied Bacitracin Tetanus ordered Site anesthetized, irrigated with NS, explored without evidence of foreign body, wound well approximated, site covered with dry, sterile dressing.  Patient tolerated procedure  well without complications. Instructions for care discussed verbally and patient provided with additional written instructions for homecare and f/u.  Labs Review Labs Reviewed - No data to display  Imaging Review No results found.   EKG Interpretation None      MDM   Final diagnoses:   Laceration    1. Finger laceration  Uncomplicated flap laceration involving superficial structures only. 3 tacking sutures applied.  I personally performed the services described in this documentation, which was scribed in my presence. The recorded information has been reviewed and is accurate.     Elpidio Anis, PA-C 07/03/15 2053  Zadie Rhine, MD 07/03/15 [phone]

## 2015-07-03 NOTE — ED Notes (Signed)
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 End of finger is almost fully separated from the rest of the finger including the fingernail. Unknown when last tetanus shot was. Finger is actively bleeding. PA aware-in to see patient.

## 2015-07-03 NOTE — Discharge Instructions (Signed)
Sutured Wound Care °Sutures are stitches that can be used to close wounds. Wound care helps prevent pain and infection.  °HOME CARE INSTRUCTIONS  °· Rest and elevate the injured area until all the pain and swelling are gone. °· Only take over-the-counter or prescription medicines for pain, discomfort, or fever as directed by your caregiver. °· After 48 hours, gently wash the area with mild soap and water once a day, or as directed. Rinse off the soap. Pat the area dry with a clean towel. Do not rub the wound. This may cause bleeding. °· Follow your caregiver's instructions for how often to change the bandage (dressing). Stop using a dressing after 2 days or after the wound stops draining. °· If the dressing sticks, moisten it with soapy water and gently remove it. °· Apply ointment on the wound as directed. °· Avoid stretching a sutured wound. °· Drink enough fluids to keep your urine clear or pale yellow. °· Follow up with your caregiver for suture removal as directed. °· Use sunscreen on your wound for the next 3 to 6 months so the scar will not darken. °SEEK IMMEDIATE MEDICAL CARE IF:  °· Your wound becomes red, swollen, hot, or tender. °· You have increasing pain in the wound. °· You have a red streak that extends from the wound. °· There is pus coming from the wound. °· You have a fever. °· You have shaking chills. °· There is a bad smell coming from the wound. °· You have persistent bleeding from the wound. °MAKE SURE YOU:  °· Understand these instructions. °· Will watch your condition. °· Will get help right away if you are not doing well or get worse. °Document Released: 12/29/2004 Document Revised: 02/13/2012 Document Reviewed: 03/27/2011 °[employer]® Patient Information ©2015 [employer]. This information is not intended to replace advice given to you by your health care provider. Make sure you discuss any questions you have with your health care provider. ° °

## 2015-07-10 ENCOUNTER — Encounter (HOSPITAL_COMMUNITY): Payer: Self-pay | Admitting: Emergency Medicine

## 2015-07-10 ENCOUNTER — Emergency Department (HOSPITAL_COMMUNITY)
Admission: EM | Admit: 2015-07-10 | Discharge: 2015-07-10 | Disposition: A | Discharge status: Home / Self Care | Payer: Self-pay | Attending: Emergency Medicine | Admitting: Emergency Medicine

## 2015-07-10 DIAGNOSIS — Z792 Long term (current) use of antibiotics: Secondary | ICD-10-CM | POA: Insufficient documentation

## 2015-07-10 DIAGNOSIS — Z4802 Encounter for removal of sutures: Secondary | ICD-10-CM | POA: Insufficient documentation

## 2015-07-10 DIAGNOSIS — Z720 Tobacco use: Secondary | ICD-10-CM | POA: Insufficient documentation

## 2015-07-10 NOTE — Discharge Instructions (Signed)

## 2015-07-10 NOTE — ED Provider Notes (Signed)
CSN: [id number]     Arrival date & time 07/10/15  1609 History  This chart was scribed for non-physician practitioner, Langston Masker, PA-C working with Mancel Bale, MD, by Jarvis Morgan, ED Scribe. This patient was seen in room WTR8/WTR8 and the patient's care was started at 4:53 PM.    Chief Complaint  Patient presents with  . Suture / Staple Removal    L 5th finger, denies complaints    Patient is a 39 y.o. male presenting with suture removal. The history is provided by the patient. No language interpreter was used.  Suture / Staple Removal This is a new problem. The current episode started more than 1 week ago. The problem occurs rarely. The problem has not changed since onset.Nothing aggravates the symptoms. Nothing relieves the symptoms. He has tried nothing for the symptoms.    HPI Comments: Christopher Moon is a 39 y.o. male who presents to the Emergency Department for a suture removal to left 5th finger. Pt initially injured the site 1 week ago. 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 Pt is right hand dominant. He received a tetanus injection, 7 days ago, when the stiches were placed. 3 sutures were placed at that time. Pt has not been taking any abx. He denies any fevers, chills, nausea, vomiting, redness, drainage, or warmth.    History reviewed. No pertinent past medical history. Past Surgical History  Procedure Laterality Date  . Abdominal surgery     No family history on file. History  Substance Use Topics  . Smoking status: Current Every Day Smoker -- 0.50 packs/day  . Smokeless tobacco: Not on file  . Alcohol Use: Yes     Comment: Quit Feb 2014    Review of Systems  Constitutional: Negative for fever and chills.  Gastrointestinal: Negative for nausea and vomiting.  Musculoskeletal: Negative for joint swelling.  Skin: Positive for wound (3 sutures placed to left 5th finger). Negative for color change.  All other systems reviewed  and are negative.     Allergies  Review of patient's allergies indicates no known allergies.  Home Medications   Prior to Admission medications   Medication Sig Start Date End Date Taking? Authorizing Provider  cephALEXin (KEFLEX) 500 MG capsule Take 1 capsule (500 mg total) by mouth 4 (four) times daily. 02/11/15   Derwood Kaplan, MD  HYDROcodone-acetaminophen (NORCO/VICODIN) 5-325 MG per tablet Take 2 tablets by mouth every 4 (four) hours as needed for moderate pain or severe pain. Patient not taking: Reported on 02/11/2015 12/23/14   Emilia Beck, PA-C  sulfamethoxazole-trimethoprim (SEPTRA DS) 800-160 MG per tablet Take 1 tablet by mouth 2 (two) times daily. 02/11/15   Derwood Kaplan, MD   Triage Vitals: BP 145/92 mmHg  Pulse 103  Temp(Src) 98.7 F (37.1 C) (Oral)  SpO2 97%  Physical Exam  Constitutional: He is oriented to person, place, and time. He appears well-developed and well-nourished. No distress.  HENT:  Head: Normocephalic and atraumatic.  Eyes: Conjunctivae and EOM are normal.  Neck: Neck supple. No tracheal deviation present.  Cardiovascular: Normal rate.   Pulmonary/Chest: Effort normal. No respiratory distress.  Musculoskeletal: Normal range of motion.  Neurological: He is alert and oriented to person, place, and time.  Skin: Skin is warm and dry.  Psychiatric: He has a normal mood and affect. His behavior is normal.  Nursing note and vitals reviewed.   ED Course  Procedures (including critical care time)  DIAGNOSTIC  STUDIES: Oxygen Saturation is 97% on RA, normal by my interpretation.    COORDINATION OF CARE: SUTURE REMOVAL Performed by: Langston Masker, PA-C Consent: Verbal consent obtained. Patient identity confirmed: provided demographic data Time out: Immediately prior to procedure a "time out" was called to verify the correct patient, procedure, equipment, support staff and site/side marked as required. Location: L 5th finger  Wound Appearance:  clean Sutures/Staples Removed: 3 removed Patient tolerance: Patient tolerated the procedure well with no immediate complications.      Labs Review Labs Reviewed - No data to display  Imaging Review No results found.   EKG Interpretation None      MDM   Final diagnoses:  Visit for suture removal    I personally performed the services in this documentation, which was scribed in my presence.  The recorded information has been reviewed and considered.   Barnet Pall.    Lonia Skinner [place], PA-C 07/10/15 1758  Mancel Bale, MD 07/10/15 [phone]

## 2015-07-10 NOTE — ED Notes (Signed)
Pt A+ox4, reports here for suture removal to L 5th finger.  Pt denies complaints.  Skin otherwise PWD.  MAEI.  Ambulatory with steady gait.  Speaking full/clear sentences, rr even/un-lab.  NAD.

## 2016-11-16 ENCOUNTER — Emergency Department (HOSPITAL_COMMUNITY)
Admission: EM | Admit: 2016-11-16 | Discharge: 2016-11-17 | Disposition: A | Discharge status: Home / Self Care | Payer: Self-pay | Attending: Emergency Medicine | Admitting: Emergency Medicine

## 2016-11-16 DIAGNOSIS — G930 Cerebral cysts: Secondary | ICD-10-CM | POA: Insufficient documentation

## 2016-11-16 DIAGNOSIS — F10120 Alcohol abuse with intoxication, uncomplicated: Secondary | ICD-10-CM | POA: Insufficient documentation

## 2016-11-16 DIAGNOSIS — F17200 Nicotine dependence, unspecified, uncomplicated: Secondary | ICD-10-CM | POA: Insufficient documentation

## 2016-11-16 DIAGNOSIS — R569 Unspecified convulsions: Secondary | ICD-10-CM | POA: Insufficient documentation

## 2016-11-16 DIAGNOSIS — F10920 Alcohol use, unspecified with intoxication, uncomplicated: Secondary | ICD-10-CM

## 2016-11-16 DIAGNOSIS — Q046 Congenital cerebral cysts: Secondary | ICD-10-CM

## 2016-11-16 NOTE — ED Triage Notes (Signed)
Pt states that he has been drinking daily for several months and wants help getting sober. Alert and oriented. Denies SI/HI.

## 2016-11-17 ENCOUNTER — Emergency Department (HOSPITAL_COMMUNITY): Payer: Self-pay

## 2016-11-17 LAB — COMPREHENSIVE METABOLIC PANEL
ALBUMIN: 3.7 g/dL (ref 3.5–5.0)
ALT: 36 U/L (ref 17–63)
ANION GAP: 6 (ref 5–15)
AST: 39 U/L (ref 15–41)
Alkaline Phosphatase: 97 U/L (ref 38–126)
BUN: 5 mg/dL — ABNORMAL LOW (ref 6–20)
CHLORIDE: 104 mmol/L (ref 101–111)
CO2: 28 mmol/L (ref 22–32)
Calcium: 8.3 mg/dL — ABNORMAL LOW (ref 8.9–10.3)
Creatinine, Ser: 0.92 mg/dL (ref 0.61–1.24)
GFR calc non Af Amer: >60 mL/min (ref >60)
GLUCOSE: 109 mg/dL — AB (ref 65–99)
Potassium: 3.5 mmol/L (ref 3.5–5.1)
SODIUM: 138 mmol/L (ref 135–145)
Total Bilirubin: 0.7 mg/dL (ref 0.3–1.2)
Total Protein: 7.4 g/dL (ref 6.5–8.1)

## 2016-11-17 LAB — CBC
HCT: 44.1 % (ref 39.0–52.0)
Hemoglobin: 15.5 g/dL (ref 13.0–17.0)
MCH: 29.5 pg (ref 26.0–34.0)
MCHC: 35.1 g/dL (ref 30.0–36.0)
MCV: 84 fL (ref 78.0–100.0)
PLATELETS: 254 10*3/uL (ref 150–400)
RBC: 5.25 MIL/uL (ref 4.22–5.81)
RDW: 13.6 % (ref 11.5–15.5)
WBC: 7 10*3/uL (ref 4.0–10.5)

## 2016-11-17 LAB — ETHANOL: Alcohol, Ethyl (B): 267 mg/dL — ABNORMAL HIGH (ref <5)

## 2016-11-17 MED ORDER — CARBAMAZEPINE 200 MG PO TABS: ORAL_TABLET | ORAL | 0 refills | Status: DC

## 2016-11-17 NOTE — Discharge Instructions (Signed)
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 This is not concerning at this time, but should be followed up with by a specialist. If this increases in size, it may result in persistent headache, dizziness, or excess fluid around your brain. There is no evidence of excess fluid on your brain today. Your blood work is reassuring.  We advise that you try to discontinue drinking alcohol. You may use Tegretol as prescribed to prevent alcohol withdrawal symptoms. Referred to the resource guide for alcohol detox and rehabilitation facilities in the area. You may return to the emergency department for new or concerning symptoms.

## 2016-11-17 NOTE — ED Provider Notes (Signed)
[facility]-EMERGENCY DEPT Provider Note   CSN: [id number] Arrival date & time: 11/16/16  2111  By signing my name below, I, Christopher Moon, attest that this documentation has been prepared under the direction and in the presence of TRW AutomotiveKelly Mishawn Hemann, PA-C. Electronically Signed: Javier Dockerobert Ryan Moon, ER Scribe. 07/16/2016. 1:13 AM.    History   Chief Complaint Chief Complaint  Patient presents with  . Alcohol Problem    HPI  HPI Comments: Christopher Moon is a 40 y.o. male who presents to the Emergency Department complaining of alcohol abuse. He last drank today. He drank four 24 oz bottles of beer and one fifth of liquor, which is normal for him. He also reports using MDMA over the past month and smoking mariajuana today. He states he was seen having seizure like activity earlier today and that is why he reported to the ED. He denies SI or HI. He denies past family hx of seizures. He states he has not eaten anything in several days.    No past medical history on file.  There are no active problems to display for this patient.   Past Surgical History:  Procedure Laterality Date  . ABDOMINAL SURGERY       Home Medications    Prior to Admission medications   Medication Sig Start Date End Date Taking? Authorizing Provider  carbamazepine (TEGRETOL) 200 MG tablet 800mg  PO QD X 1D, then 600mg  PO QD X 1D, then 400mg  QD X 1D, then 200mg  PO QD X 2D Take for alcohol withdrawal symptoms 11/17/16   Antony MaduraKelly Madison Albea, PA-C  cephALEXin (KEFLEX) 500 MG capsule Take 1 capsule (500 mg total) by mouth 4 (four) times daily. Patient not taking: Reported on 11/17/2016 02/11/15   Derwood KaplanAnkit Nanavati, MD  HYDROcodone-acetaminophen (NORCO/VICODIN) 5-325 MG per tablet Take 2 tablets by mouth every 4 (four) hours as needed for moderate pain or severe pain. Patient not taking: Reported on 02/11/2015 12/23/14   Emilia BeckKaitlyn Szekalski, PA-C  sulfamethoxazole-trimethoprim (SEPTRA DS) 800-160 MG per tablet Take 1 tablet by mouth 2 (two)  times daily. Patient not taking: Reported on 11/17/2016 02/11/15   Derwood KaplanAnkit Nanavati, MD    Family History No family history on file.  Social History Social History  Substance Use Topics  . Smoking status: Current Every Day Smoker    Packs/day: 0.50  . Smokeless tobacco: Not on file  . Alcohol use Yes     Comment: Quit Feb 2014     Allergies   Patient has no known allergies.   Review of Systems Review of Systems A complete 10 system review of systems was obtained and all systems are negative except as noted in the HPI and PMH.    Physical Exam Updated Vital Signs BP 100/67 (BP Location: Right Arm)   Pulse 70   Temp 97.7 F (36.5 C) (Oral)   Resp 20   SpO2 93%   Physical Exam  Constitutional: He is oriented to person, place, and time. He appears well-developed and well-nourished. No distress.  Calm and cooperative. Disheveled.  HENT:  Head: Normocephalic and atraumatic.  Mouth/Throat: Oropharynx is clear and moist.  Eyes: EOM are normal. Pupils are equal, round, and reactive to light. No scleral icterus.  Mild conjunctival injection bilaterally  Neck: Normal range of motion.  No nuchal rigidity or meningismus  Cardiovascular: Normal rate, regular rhythm and intact distal pulses.   Pulmonary/Chest: Effort normal. No respiratory distress. He has no wheezes. He has no rales.  Respirations even and unlabored  Musculoskeletal:  Normal range of motion.  Neurological: He is alert and oriented to person, place, and time. No cranial nerve deficit. He exhibits normal muscle tone. Coordination normal.  GCS 15. Speech is goal oriented. Patient answers questions appropriately and follows commands. He is ambulatory with steady gait.  Skin: Skin is warm and dry. No rash noted. He is not diaphoretic. No erythema. No pallor.  Psychiatric: He has a normal mood and affect. His behavior is normal.  Nursing note and vitals reviewed.    ED Treatments / Results  Labs (all labs ordered  are listed, but only abnormal results are displayed) Labs Reviewed  COMPREHENSIVE METABOLIC PANEL - Abnormal; Notable for the following:       Result Value   Glucose, Bld 109 (*)    BUN 5 (*)    Calcium 8.3 (*)    All other components within normal limits  ETHANOL - Abnormal; Notable for the following:    Alcohol, Ethyl (B) 267 (*)    All other components within normal limits  CBC    ED ECG REPORT   Date: 11/17/2016  Rate: 77  Rhythm: normal sinus rhythm  QRS Axis: normal  Intervals: normal  ST/T Wave abnormalities: normal  Conduction Disutrbances:none  Narrative Interpretation: NSR; no STEMI  Old EKG Reviewed: none available  I have personally reviewed the EKG tracing and agree with the computerized printout as noted.   Radiology Ct Head Wo Contrast  Result Date: 11/17/2016 CLINICAL DATA:  40 y/o M; seizure 11/16/2016, pain on top of head, unsure of injury. EXAM: CT HEAD WITHOUT CONTRAST TECHNIQUE: Contiguous axial images were obtained from the base of the skull through the vertex without intravenous contrast. COMPARISON:  None. FINDINGS: Brain: No evidence of acute infarction, hemorrhage, hydrocephalus, or extra-axial collection. Round hyperdense nodule measuring 7 mm in the midline at foramen of Monro. Vascular: No hyperdense vessel or unexpected calcification. Skull: Normal. Negative for fracture or focal lesion. Sinuses/Orbits: Opacification of right frontal sinus antrum in anterior ethmoid air cells. Otherwise visualized paranasal sinuses and mastoid air cells are normally aerated. Orbits are unremarkable. Other: None. IMPRESSION: 1. No acute intracranial abnormality identified. 2. Incidental round hyperdense nodule measuring 7 mm in the midline at foramen of Monro, typical of colloid cyst. No hydrocephalus at this time. Further characterization with MRI of the brain with contrast is recommended. These results were called by telephone at the time of interpretation on 11/17/2016  at 3:02 am to Dr. Antony MaduraKELLY Kavish Lafitte , who verbally acknowledged these results. Electronically Signed   By: Mitzi HansenLance  Furusawa-Stratton M.D.   On: 11/17/2016 03:02    Procedures Procedures (including critical care time)  Medications Ordered in ED Medications - No data to display   Initial Impression / Assessment and Plan / ED Course  I have reviewed the triage vital signs and the nursing notes.  Pertinent labs & imaging results that were available during my care of the patient were reviewed by me and considered in my medical decision making (see chart for details).  Clinical Course     40 year old male comes to the emergency department for reported seizure-like activity. He does not recall this event and reports having a fifth of liquor and multiple beers prior to arrival. Shane CrutchStory is not consistent with seizure from alcohol withdrawal as patient acutely intoxicated on arrival. He has been monitored in the emergency department for over 7 hours without any repeat seizure activity. He has no personal or family history of seizures. Laboratory workup is reassuring. There  is no leukocytosis.  CT head performed given reported seizure-like activity. This reveals an incidental 7 mm Colloid cyst without evidence of hydrocephalus. It is unlikely for this to be a complicating feature of patient's symptoms today. Question legitimacy of seizure activity. This also may have been provoked by substance use as he reports mixing ETOH with MDMA and marijuana today. He has a nonfocal neurologic exam and has remained stable throughout ED observation. I believe he is appropriate for outpatient follow-up with neurosurgery.   Patient states that he does desire to stop drinking. Prescription provided for Tegretol taper. The patient has been instructed not to take this medication with alcohol, should he not be able to successfully detox. Resource guide provided should he choose to seek out alcohol rehabilitation facilities. I do not  believe further emergent workup is indicated at this time. Patient discharged in stable condition with no unaddressed concerns.   Vitals:   11/16/16 2145 11/17/16 0249 11/17/16 0521 11/17/16 0526  BP: 116/89 105/66 100/67 100/67  Pulse: 112 74 70 70  Resp: 18 18  20   Temp: 98.1 F (36.7 C) 97.6 F (36.4 C)  97.7 F (36.5 C)  TempSrc: Oral Oral  Oral  SpO2: 97% 98%  93%    Final Clinical Impressions(s) / ED Diagnoses   Final diagnoses:  Colloid cyst of brain (HCC)  Alcoholic intoxication without complication (HCC)  Observed seizure-like activity (HCC)    New Prescriptions New Prescriptions   CARBAMAZEPINE (TEGRETOL) 200 MG TABLET    800mg  PO QD X 1D, then 600mg  PO QD X 1D, then 400mg  QD X 1D, then 200mg  PO QD X 2D Take for alcohol withdrawal symptoms    I personally performed the services described in this documentation, which was scribed in my presence. The recorded information has been reviewed and is accurate.       Antony Madura, PA-C 11/17/16 0715    Antony Madura, PA-C 11/17/16 1610    Arby Barrette, MD 11/17/16 [phone]

## 2017-03-31 ENCOUNTER — Encounter (HOSPITAL_COMMUNITY): Payer: Self-pay

## 2017-03-31 ENCOUNTER — Emergency Department (HOSPITAL_COMMUNITY): Payer: Self-pay

## 2017-03-31 DIAGNOSIS — R112 Nausea with vomiting, unspecified: Secondary | ICD-10-CM | POA: Insufficient documentation

## 2017-03-31 DIAGNOSIS — F17200 Nicotine dependence, unspecified, uncomplicated: Secondary | ICD-10-CM | POA: Insufficient documentation

## 2017-03-31 DIAGNOSIS — Z79899 Other long term (current) drug therapy: Secondary | ICD-10-CM | POA: Insufficient documentation

## 2017-03-31 DIAGNOSIS — R1084 Generalized abdominal pain: Secondary | ICD-10-CM | POA: Insufficient documentation

## 2017-03-31 LAB — URINALYSIS, ROUTINE W REFLEX MICROSCOPIC
BACTERIA UA: NONE SEEN
Bilirubin Urine: NEGATIVE
Glucose, UA: NEGATIVE mg/dL
Ketones, ur: NEGATIVE mg/dL
Leukocytes, UA: NEGATIVE
NITRITE: NEGATIVE
Protein, ur: NEGATIVE mg/dL
SPECIFIC GRAVITY, URINE: 1.027 (ref 1.005–1.030)
SQUAMOUS EPITHELIAL / LPF: NONE SEEN
pH: 5 (ref 5.0–8.0)

## 2017-03-31 LAB — COMPREHENSIVE METABOLIC PANEL
ALBUMIN: 3.9 g/dL (ref 3.5–5.0)
ALK PHOS: 85 U/L (ref 38–126)
ALT: 37 U/L (ref 17–63)
AST: 36 U/L (ref 15–41)
Anion gap: 10 (ref 5–15)
BILIRUBIN TOTAL: 0.6 mg/dL (ref 0.3–1.2)
BUN: 10 mg/dL (ref 6–20)
CO2: 23 mmol/L (ref 22–32)
CREATININE: 1.07 mg/dL (ref 0.61–1.24)
Calcium: 8.9 mg/dL (ref 8.9–10.3)
Chloride: 101 mmol/L (ref 101–111)
GFR calc Af Amer: >60 mL/min (ref >60)
GFR calc non Af Amer: >60 mL/min (ref >60)
GLUCOSE: 131 mg/dL — AB (ref 65–99)
POTASSIUM: 3.9 mmol/L (ref 3.5–5.1)
Sodium: 134 mmol/L — ABNORMAL LOW (ref 135–145)
TOTAL PROTEIN: 7.3 g/dL (ref 6.5–8.1)

## 2017-03-31 LAB — CBC
HEMATOCRIT: 44.7 % (ref 39.0–52.0)
Hemoglobin: 15.2 g/dL (ref 13.0–17.0)
MCH: 29 pg (ref 26.0–34.0)
MCHC: 34 g/dL (ref 30.0–36.0)
MCV: 85.3 fL (ref 78.0–100.0)
Platelets: 272 10*3/uL (ref 150–400)
RBC: 5.24 MIL/uL (ref 4.22–5.81)
RDW: 13.8 % (ref 11.5–15.5)
WBC: 12.4 10*3/uL — ABNORMAL HIGH (ref 4.0–10.5)

## 2017-03-31 LAB — LIPASE, BLOOD: Lipase: 22 U/L (ref 11–51)

## 2017-03-31 MED ORDER — ONDANSETRON 4 MG PO TBDP
4.0000 mg | ORAL_TABLET | Freq: Once | ORAL | Status: AC
  Administered 2017-03-31: 4 mg via ORAL

## 2017-03-31 MED ORDER — ONDANSETRON 4 MG PO TBDP
ORAL_TABLET | ORAL | Status: AC
  Filled 2017-03-31: qty 1

## 2017-03-31 NOTE — ED Triage Notes (Signed)
Pt complaining of N/V after dinner. Pt states emesis x 4 today. Pt also complaining of SOB. Pt sats = 100% on RA. VSS.

## 2017-04-01 ENCOUNTER — Emergency Department (HOSPITAL_COMMUNITY): Payer: Self-pay

## 2017-04-01 ENCOUNTER — Encounter (HOSPITAL_COMMUNITY): Payer: Self-pay | Admitting: *Deleted

## 2017-04-01 ENCOUNTER — Emergency Department (HOSPITAL_COMMUNITY)
Admission: EM | Admit: 2017-04-01 | Discharge: 2017-04-01 | Disposition: A | Discharge status: Home / Self Care | Payer: Self-pay | Attending: Emergency Medicine | Admitting: Emergency Medicine

## 2017-04-01 DIAGNOSIS — R112 Nausea with vomiting, unspecified: Secondary | ICD-10-CM

## 2017-04-01 DIAGNOSIS — R1084 Generalized abdominal pain: Secondary | ICD-10-CM

## 2017-04-01 MED ORDER — ONDANSETRON 4 MG PO TBDP: 4.0000 mg | ORAL_TABLET | Freq: Three times a day (TID) | ORAL | 0 refills | Status: DC | PRN

## 2017-04-01 MED ORDER — IOPAMIDOL (ISOVUE-300) INJECTION 61%
INTRAVENOUS | Status: AC
  Administered 2017-04-01: 100 mL
  Filled 2017-04-01: qty 100
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
## 2017-04-01 NOTE — ED Notes (Signed)
Patient transported to X-ray 

## 2017-04-01 NOTE — ED Notes (Signed)
Pt given gingerale for fluid challenge. Pt reporting no nausea or vomiting. No more abd pain. Pt tolerating fluids well. Will continue to monitor.

## 2017-04-01 NOTE — ED Notes (Signed)
Pt is in stable condition upon d/c and ambulates from ED. 

## 2017-04-01 NOTE — ED Notes (Signed)
Pt to CT

## 2017-04-01 NOTE — Discharge Instructions (Signed)
As discussed, slowly reintroduce foods starting with fluids and broths. Follow up with your primary care provider.  Monitor for any worsening and return to ED if pain worsens, fever, chills, unable to eat or drink.
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
## 2017-04-02 ENCOUNTER — Emergency Department (HOSPITAL_COMMUNITY)
Admission: EM | Admit: 2017-04-02 | Discharge: 2017-04-03 | Disposition: A | Discharge status: Home / Self Care | Payer: PRIVATE HEALTH INSURANCE | Attending: Emergency Medicine | Admitting: Emergency Medicine

## 2017-04-02 ENCOUNTER — Encounter (HOSPITAL_COMMUNITY): Payer: Self-pay | Admitting: Emergency Medicine

## 2017-04-02 DIAGNOSIS — R197 Diarrhea, unspecified: Secondary | ICD-10-CM | POA: Insufficient documentation

## 2017-04-02 DIAGNOSIS — R112 Nausea with vomiting, unspecified: Secondary | ICD-10-CM | POA: Insufficient documentation

## 2017-04-02 DIAGNOSIS — F17200 Nicotine dependence, unspecified, uncomplicated: Secondary | ICD-10-CM | POA: Insufficient documentation

## 2017-04-02 NOTE — ED Triage Notes (Signed)
Pt presents with n /v/d x 2 days. Emesis x 2 today with diarrhea x 3. Pt states he was seen for same Friday night.

## 2017-04-03 LAB — I-STAT CHEM 8, ED
BUN: 7 mg/dL (ref 6–20)
CALCIUM ION: 1.09 mmol/L — AB (ref 1.15–1.40)
CHLORIDE: 99 mmol/L — AB (ref 101–111)
Creatinine, Ser: 1.2 mg/dL (ref 0.61–1.24)
Glucose, Bld: 106 mg/dL — ABNORMAL HIGH (ref 65–99)
HEMATOCRIT: 43 % (ref 39.0–52.0)
Hemoglobin: 14.6 g/dL (ref 13.0–17.0)
POTASSIUM: 3.6 mmol/L (ref 3.5–5.1)
SODIUM: 137 mmol/L (ref 135–145)
TCO2: 26 mmol/L (ref 0–100)

## 2017-04-03 MED ORDER — SODIUM CHLORIDE 0.9 % IV BOLUS (SEPSIS)
1000.0000 mL | Freq: Once | INTRAVENOUS | Status: AC
  Administered 2017-04-03: 1000 mL via INTRAVENOUS

## 2017-04-03 MED ORDER — ONDANSETRON HCL 4 MG/2ML IJ SOLN
4.0000 mg | Freq: Once | INTRAMUSCULAR | Status: AC
  Administered 2017-04-03: 4 mg via INTRAVENOUS
  Filled 2017-04-03: qty 2

## 2017-04-03 MED ORDER — ONDANSETRON 4 MG PO TBDP: 4.0000 mg | ORAL_TABLET | Freq: Once | ORAL | Status: DC

## 2017-04-03 NOTE — Discharge Instructions (Signed)
Read the information below.  You may return to the Emergency Department at any time for worsening condition or any new symptoms that concern you.  If you develop high fevers, worsening abdominal pain, uncontrolled vomiting, or are unable to tolerate fluids by mouth, return to the ER for a recheck.   °

## 2017-04-03 NOTE — ED Notes (Signed)
Pt reports he was able to keep soda down
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
## 2017-05-08 ENCOUNTER — Encounter (HOSPITAL_COMMUNITY): Payer: Self-pay | Admitting: Emergency Medicine

## 2017-05-08 ENCOUNTER — Emergency Department (HOSPITAL_COMMUNITY)
Admission: EM | Admit: 2017-05-08 | Discharge: 2017-05-08 | Disposition: A | Discharge status: Home / Self Care | Payer: PRIVATE HEALTH INSURANCE | Attending: Emergency Medicine | Admitting: Emergency Medicine

## 2017-05-08 DIAGNOSIS — R197 Diarrhea, unspecified: Secondary | ICD-10-CM | POA: Insufficient documentation

## 2017-05-08 DIAGNOSIS — R112 Nausea with vomiting, unspecified: Secondary | ICD-10-CM | POA: Insufficient documentation

## 2017-05-08 DIAGNOSIS — Z79899 Other long term (current) drug therapy: Secondary | ICD-10-CM | POA: Insufficient documentation

## 2017-05-08 DIAGNOSIS — F17200 Nicotine dependence, unspecified, uncomplicated: Secondary | ICD-10-CM | POA: Insufficient documentation

## 2017-05-08 LAB — CBC WITH DIFFERENTIAL/PLATELET
BASOS ABS: 0 10*3/uL (ref 0.0–0.1)
BASOS PCT: 0 %
EOS PCT: 1 %
Eosinophils Absolute: 0.1 10*3/uL (ref 0.0–0.7)
HCT: 43 % (ref 39.0–52.0)
Hemoglobin: 14.8 g/dL (ref 13.0–17.0)
LYMPHS PCT: 8 %
Lymphs Abs: 0.6 10*3/uL — ABNORMAL LOW (ref 0.7–4.0)
MCH: 29.7 pg (ref 26.0–34.0)
MCHC: 34.4 g/dL (ref 30.0–36.0)
MCV: 86.2 fL (ref 78.0–100.0)
Monocytes Absolute: 0.5 10*3/uL (ref 0.1–1.0)
Monocytes Relative: 6 %
NEUTROS ABS: 7 10*3/uL (ref 1.7–7.7)
Neutrophils Relative %: 85 %
PLATELETS: 197 10*3/uL (ref 150–400)
RBC: 4.99 MIL/uL (ref 4.22–5.81)
RDW: 13.5 % (ref 11.5–15.5)
WBC: 8.2 10*3/uL (ref 4.0–10.5)

## 2017-05-08 LAB — COMPREHENSIVE METABOLIC PANEL
ALBUMIN: 3.6 g/dL (ref 3.5–5.0)
ALT: 77 U/L — ABNORMAL HIGH (ref 17–63)
AST: 83 U/L — ABNORMAL HIGH (ref 15–41)
Alkaline Phosphatase: 100 U/L (ref 38–126)
Anion gap: 8 (ref 5–15)
BUN: 8 mg/dL (ref 6–20)
CHLORIDE: 101 mmol/L (ref 101–111)
CO2: 25 mmol/L (ref 22–32)
Calcium: 8.8 mg/dL — ABNORMAL LOW (ref 8.9–10.3)
Creatinine, Ser: 1.01 mg/dL (ref 0.61–1.24)
GFR calc Af Amer: >60 mL/min (ref >60)
GLUCOSE: 120 mg/dL — AB (ref 65–99)
POTASSIUM: 4.2 mmol/L (ref 3.5–5.1)
Sodium: 134 mmol/L — ABNORMAL LOW (ref 135–145)
Total Bilirubin: 2 mg/dL — ABNORMAL HIGH (ref 0.3–1.2)
Total Protein: 7.3 g/dL (ref 6.5–8.1)

## 2017-05-08 LAB — LIPASE, BLOOD: LIPASE: 25 U/L (ref 11–51)

## 2017-05-08 MED ORDER — PROMETHAZINE HCL 25 MG PO TABS: 25.0000 mg | ORAL_TABLET | Freq: Four times a day (QID) | ORAL | 0 refills | Status: DC | PRN

## 2017-05-08 MED ORDER — ONDANSETRON HCL 4 MG/2ML IJ SOLN
4.0000 mg | Freq: Once | INTRAMUSCULAR | Status: AC
  Administered 2017-05-08: 4 mg via INTRAVENOUS
  Filled 2017-05-08: qty 2

## 2017-05-08 MED ORDER — SODIUM CHLORIDE 0.9 % IV BOLUS (SEPSIS)
1000.0000 mL | Freq: Once | INTRAVENOUS | Status: AC
  Administered 2017-05-08: 1000 mL via INTRAVENOUS
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
## 2017-05-08 NOTE — ED Notes (Signed)
Pt tolerating PO fluids. Dr. Adriana Simasook notified.

## 2017-05-08 NOTE — ED Triage Notes (Signed)
Pt reports having vomiting and diarrhea for the last 24 hr and reports to being an alcoholic and last drink was at 1200 on 05/07/17. Pt reports 7 plus episodes of vomiting.

## 2017-05-08 NOTE — Discharge Instructions (Signed)
Increase fluids. Prescription for nausea medicine.  Return if worse.

## 2017-05-09 ENCOUNTER — Encounter (HOSPITAL_COMMUNITY): Payer: Self-pay | Admitting: Emergency Medicine

## 2017-05-09 ENCOUNTER — Emergency Department (HOSPITAL_COMMUNITY)
Admission: EM | Admit: 2017-05-09 | Discharge: 2017-05-09 | Disposition: A | Discharge status: Home / Self Care | Payer: PRIVATE HEALTH INSURANCE | Attending: Emergency Medicine | Admitting: Emergency Medicine

## 2017-05-09 DIAGNOSIS — F17200 Nicotine dependence, unspecified, uncomplicated: Secondary | ICD-10-CM | POA: Insufficient documentation

## 2017-05-09 DIAGNOSIS — Z79899 Other long term (current) drug therapy: Secondary | ICD-10-CM | POA: Insufficient documentation

## 2017-05-09 DIAGNOSIS — R197 Diarrhea, unspecified: Secondary | ICD-10-CM

## 2017-05-09 HISTORY — DX: Accidental discharge from unspecified firearms or gun, initial encounter: W34.00XA

## 2017-05-09 HISTORY — DX: Unspecified firearm discharge, undetermined intent, initial encounter: Y24.9XXA

## 2017-05-09 NOTE — ED Triage Notes (Signed)
PT states he was seen here last night for nausea, vomiting, and diarrhea  Pt states today he eats and only has diarrhea but when he eats it goes straight through him  Pt states he feels hungry
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
## 2017-05-20 ENCOUNTER — Encounter (HOSPITAL_COMMUNITY): Payer: Self-pay | Admitting: Emergency Medicine

## 2017-05-20 ENCOUNTER — Emergency Department (HOSPITAL_COMMUNITY)
Admission: EM | Admit: 2017-05-20 | Discharge: 2017-05-20 | Disposition: A | Discharge status: Home / Self Care | Payer: PRIVATE HEALTH INSURANCE | Attending: Emergency Medicine | Admitting: Emergency Medicine

## 2017-05-20 DIAGNOSIS — F14182 Cocaine abuse with cocaine-induced sleep disorder: Secondary | ICD-10-CM | POA: Insufficient documentation

## 2017-05-20 DIAGNOSIS — F1910 Other psychoactive substance abuse, uncomplicated: Secondary | ICD-10-CM

## 2017-05-20 DIAGNOSIS — F19982 Other psychoactive substance use, unspecified with psychoactive substance-induced sleep disorder: Secondary | ICD-10-CM

## 2017-05-20 DIAGNOSIS — Z79899 Other long term (current) drug therapy: Secondary | ICD-10-CM | POA: Insufficient documentation

## 2017-05-20 DIAGNOSIS — F1210 Cannabis abuse, uncomplicated: Secondary | ICD-10-CM | POA: Insufficient documentation

## 2017-05-20 LAB — RAPID URINE DRUG SCREEN, HOSP PERFORMED
Amphetamines: NOT DETECTED
BARBITURATES: NOT DETECTED
BENZODIAZEPINES: NOT DETECTED
COCAINE: POSITIVE — AB
OPIATES: NOT DETECTED
TETRAHYDROCANNABINOL: POSITIVE — AB

## 2017-05-20 LAB — COMPREHENSIVE METABOLIC PANEL
ALT: 90 U/L — ABNORMAL HIGH (ref 17–63)
AST: 99 U/L — ABNORMAL HIGH (ref 15–41)
Albumin: 3.9 g/dL (ref 3.5–5.0)
Alkaline Phosphatase: 113 U/L (ref 38–126)
Anion gap: 9 (ref 5–15)
BILIRUBIN TOTAL: 2.6 mg/dL — AB (ref 0.3–1.2)
BUN: 8 mg/dL (ref 6–20)
CHLORIDE: 99 mmol/L — AB (ref 101–111)
CO2: 25 mmol/L (ref 22–32)
Calcium: 9.4 mg/dL (ref 8.9–10.3)
Creatinine, Ser: 1.03 mg/dL (ref 0.61–1.24)
Glucose, Bld: 113 mg/dL — ABNORMAL HIGH (ref 65–99)
POTASSIUM: 3.6 mmol/L (ref 3.5–5.1)
Sodium: 133 mmol/L — ABNORMAL LOW (ref 135–145)
TOTAL PROTEIN: 7.5 g/dL (ref 6.5–8.1)

## 2017-05-20 LAB — CBC
HEMATOCRIT: 47.1 % (ref 39.0–52.0)
HEMOGLOBIN: 15.6 g/dL (ref 13.0–17.0)
MCH: 29 pg (ref 26.0–34.0)
MCHC: 33.1 g/dL (ref 30.0–36.0)
MCV: 87.5 fL (ref 78.0–100.0)
Platelets: 200 10*3/uL (ref 150–400)
RBC: 5.38 MIL/uL (ref 4.22–5.81)
RDW: 13.4 % (ref 11.5–15.5)
WBC: 6.9 10*3/uL (ref 4.0–10.5)

## 2017-05-20 LAB — URINALYSIS, ROUTINE W REFLEX MICROSCOPIC
BACTERIA UA: NONE SEEN
Glucose, UA: NEGATIVE mg/dL
Hgb urine dipstick: NEGATIVE
KETONES UR: 5 mg/dL — AB
LEUKOCYTES UA: NEGATIVE
Nitrite: NEGATIVE
PH: 6 (ref 5.0–8.0)
Protein, ur: 100 mg/dL — AB
Specific Gravity, Urine: 1.034 — ABNORMAL HIGH (ref 1.005–1.030)

## 2017-05-20 LAB — ETHANOL

## 2017-05-20 MED ORDER — DIPHENHYDRAMINE HCL 25 MG PO CAPS: 25.0000 mg | ORAL_CAPSULE | Freq: Four times a day (QID) | ORAL | 0 refills | Status: DC | PRN

## 2017-05-20 NOTE — ED Notes (Signed)
Patient reports drinking 5-13 beers per day and 2 pints of liquor per week.  Last EtOH intake was one beer this morning at 0330.
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
## 2017-05-20 NOTE — ED Notes (Signed)
Patient unable to provide urine specimen at this time.

## 2017-07-19 ENCOUNTER — Ambulatory Visit (HOSPITAL_COMMUNITY)
Admission: RE | Admit: 2017-07-19 | Discharge: 2017-07-19 | Disposition: A | Discharge status: Home / Self Care | Payer: Self-pay | Attending: Psychiatry | Admitting: Psychiatry

## 2017-07-19 DIAGNOSIS — F1020 Alcohol dependence, uncomplicated: Secondary | ICD-10-CM | POA: Insufficient documentation

## 2017-07-19 DIAGNOSIS — F17210 Nicotine dependence, cigarettes, uncomplicated: Secondary | ICD-10-CM | POA: Insufficient documentation

## 2017-07-19 DIAGNOSIS — F1290 Cannabis use, unspecified, uncomplicated: Secondary | ICD-10-CM | POA: Insufficient documentation
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
## 2017-07-19 NOTE — H&P (Signed)
Behavioral Health Medical Screening Exam  Christopher Moon is an 41 y.o. male.  Total Time spent with patient: 30 minutes  Psychiatric Specialty Exam: Physical Exam  Review of Systems  Psychiatric/Behavioral: Positive for depression and substance abuse. Negative for hallucinations and suicidal ideas. The patient is nervous/anxious and has insomnia.   All other systems reviewed and are negative.   There were no vitals taken for this visit.There is no height or weight on file to calculate BMI.  General Appearance: Casual and Fairly Groomed  Eye Contact:  Good  Speech:  Clear and Coherent and Normal Rate  Volume:  Normal  Mood:  Anxious and Depressed  Affect:  Appropriate, Congruent and Depressed  Thought Process:  Coherent, Goal Directed, Linear and Descriptions of Associations: Intact  Orientation:  Full (Time, Place, and Person)  Thought Content:  Focused on treatment options  Suicidal Thoughts:  No  Homicidal Thoughts:  No  Memory:  Immediate;   Fair Recent;   Fair Remote;   Fair  Judgement:  Fair  Insight:  Fair  Psychomotor Activity:  Normal  Concentration: Concentration: Fair and Attention Span: Fair  Recall:  FiservFair  Fund of Knowledge:Fair  Language: Fair  Akathisia:  No  Handed:    AIMS (if indicated):     Assets:  Communication Skills Desire for Improvement Resilience Social Support  Sleep:       Musculoskeletal: Strength & Muscle Tone: within normal limits Gait & Station: normal Patient leans: N/A  VS: T 98.3 O2 100 HR 61 RR16 BP 111/70  Recommendations:  Based on my evaluation the patient does not appear to have an emergency medical condition.  Beau [name], [name] C, FNP 07/19/2017, 3:19 PM

## 2018-01-09 ENCOUNTER — Emergency Department (HOSPITAL_COMMUNITY): Payer: PRIVATE HEALTH INSURANCE

## 2018-01-09 ENCOUNTER — Encounter (HOSPITAL_COMMUNITY): Payer: Self-pay | Admitting: Emergency Medicine

## 2018-01-09 DIAGNOSIS — Y999 Unspecified external cause status: Secondary | ICD-10-CM | POA: Insufficient documentation

## 2018-01-09 DIAGNOSIS — S60031A Contusion of right middle finger without damage to nail, initial encounter: Secondary | ICD-10-CM | POA: Insufficient documentation

## 2018-01-09 DIAGNOSIS — F17210 Nicotine dependence, cigarettes, uncomplicated: Secondary | ICD-10-CM | POA: Insufficient documentation

## 2018-01-09 DIAGNOSIS — Y939 Activity, unspecified: Secondary | ICD-10-CM | POA: Insufficient documentation

## 2018-01-09 DIAGNOSIS — W230XXA Caught, crushed, jammed, or pinched between moving objects, initial encounter: Secondary | ICD-10-CM | POA: Insufficient documentation

## 2018-01-09 DIAGNOSIS — Y929 Unspecified place or not applicable: Secondary | ICD-10-CM | POA: Insufficient documentation

## 2018-01-09 MED ORDER — IBUPROFEN 200 MG PO TABS
400.0000 mg | ORAL_TABLET | Freq: Once | ORAL | Status: AC | PRN
  Administered 2018-01-09: 400 mg via ORAL
  Filled 2018-01-09: qty 2

## 2018-01-09 NOTE — ED Triage Notes (Signed)
Patient reports around 6 tonight he was helping a friend and got his right hand caught in the garage door. Swelling noted to right pointer and ring finger. Ice applied in triage.

## 2018-01-10 ENCOUNTER — Emergency Department (HOSPITAL_COMMUNITY)
Admission: EM | Admit: 2018-01-10 | Discharge: 2018-01-10 | Disposition: A | Discharge status: Home / Self Care | Payer: PRIVATE HEALTH INSURANCE | Attending: Emergency Medicine | Admitting: Emergency Medicine

## 2018-01-10 DIAGNOSIS — S6991XA Unspecified injury of right wrist, hand and finger(s), initial encounter: Secondary | ICD-10-CM

## 2018-01-10 MED ORDER — IBUPROFEN 600 MG PO TABS: 600.0000 mg | ORAL_TABLET | Freq: Four times a day (QID) | ORAL | 0 refills | Status: DC | PRN

## 2018-01-10 NOTE — ED Notes (Signed)
Bed: WA03 Expected date:  Expected time:  Means of arrival:  Comments: 

## 2018-01-10 NOTE — ED Notes (Signed)
No answer when called for room 
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
## 2018-05-24 ENCOUNTER — Emergency Department (HOSPITAL_COMMUNITY)
Admission: EM | Admit: 2018-05-24 | Discharge: 2018-05-24 | Disposition: A | Discharge status: Home / Self Care | Payer: PRIVATE HEALTH INSURANCE | Attending: Emergency Medicine | Admitting: Emergency Medicine

## 2018-05-24 DIAGNOSIS — Z79899 Other long term (current) drug therapy: Secondary | ICD-10-CM | POA: Diagnosis not present

## 2018-05-24 DIAGNOSIS — F17210 Nicotine dependence, cigarettes, uncomplicated: Secondary | ICD-10-CM | POA: Insufficient documentation

## 2018-05-24 DIAGNOSIS — R21 Rash and other nonspecific skin eruption: Secondary | ICD-10-CM | POA: Diagnosis not present

## 2018-05-24 MED ORDER — HYDROXYZINE HCL 25 MG PO TABS: 25.0000 mg | ORAL_TABLET | Freq: Four times a day (QID) | ORAL | 0 refills | Status: DC

## 2018-05-24 MED ORDER — TRIAMCINOLONE ACETONIDE 0.1 % EX CREA: 1.0000 "application " | TOPICAL_CREAM | Freq: Two times a day (BID) | CUTANEOUS | 0 refills | Status: DC

## 2018-05-24 NOTE — Discharge Instructions (Addendum)
Your rash seems consistent with a likely a contact otitis.  This is likely being caused by something at work.  Have given you cream to apply to your arms.  We will also give you hydroxyzine to take at night to help with the itching.  May take a Pepcid or Zantac during the day that will help with itching as well.  If symptoms not improving follow-up with a dermatologist or your primary care doctor and return the ED with any worsening symptoms.

## 2018-05-24 NOTE — ED Triage Notes (Addendum)
Patient came in complain of itching on bug bites on both arms and elbow. Patient stated he always get itch after work. Patient stated he is homeless.  Patient claimed he had 2 beers tonight.
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
## 2018-07-17 ENCOUNTER — Other Ambulatory Visit: Payer: Self-pay

## 2018-07-17 ENCOUNTER — Emergency Department (HOSPITAL_COMMUNITY)
Admission: EM | Admit: 2018-07-17 | Discharge: 2018-07-17 | Disposition: A | Discharge status: Home / Self Care | Payer: PRIVATE HEALTH INSURANCE | Attending: Emergency Medicine | Admitting: Emergency Medicine

## 2018-07-17 ENCOUNTER — Encounter (HOSPITAL_COMMUNITY): Payer: Self-pay | Admitting: *Deleted

## 2018-07-17 ENCOUNTER — Emergency Department (HOSPITAL_COMMUNITY): Payer: PRIVATE HEALTH INSURANCE

## 2018-07-17 DIAGNOSIS — Z79899 Other long term (current) drug therapy: Secondary | ICD-10-CM | POA: Insufficient documentation

## 2018-07-17 DIAGNOSIS — F17200 Nicotine dependence, unspecified, uncomplicated: Secondary | ICD-10-CM | POA: Insufficient documentation

## 2018-07-17 DIAGNOSIS — M25562 Pain in left knee: Secondary | ICD-10-CM

## 2018-07-17 MED ORDER — IBUPROFEN 200 MG PO TABS
600.0000 mg | ORAL_TABLET | Freq: Once | ORAL | Status: AC
  Administered 2018-07-17: 600 mg via ORAL
  Filled 2018-07-17: qty 3

## 2018-07-17 MED ORDER — IBUPROFEN 600 MG PO TABS: 600.0000 mg | ORAL_TABLET | Freq: Four times a day (QID) | ORAL | 0 refills | Status: DC | PRN

## 2018-07-17 NOTE — Discharge Instructions (Signed)
Ice and elevate the knee, use knee brace and crutches to stay off of it for the next few days, use ibuprofen up to every 6 hours as needed for pain.  If symptoms persist she will need to follow-up with Dr. Despina HickAlusio with orthopedics.  Return to the emergency department for significantly worsening pain, if your knee becomes red warm and swollen, you develop fevers or any other new or concerning symptoms.

## 2018-07-17 NOTE — ED Notes (Signed)
Patient refused an ice pack.

## 2018-07-17 NOTE — ED Triage Notes (Signed)
Pt reports that he was walking and twisted his left knee, now c/o pain in the left knee. No meds for the pain.
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
## 2018-07-17 NOTE — ED Triage Notes (Signed)
Pt arrives via [facility] with left knee pain, he was walking then started having knee pain.

## 2018-08-16 ENCOUNTER — Other Ambulatory Visit: Payer: Self-pay

## 2018-08-16 ENCOUNTER — Encounter (HOSPITAL_COMMUNITY): Payer: Self-pay | Admitting: *Deleted

## 2018-08-16 DIAGNOSIS — W269XXA Contact with unspecified sharp object(s), initial encounter: Secondary | ICD-10-CM | POA: Insufficient documentation

## 2018-08-16 DIAGNOSIS — Z23 Encounter for immunization: Secondary | ICD-10-CM | POA: Insufficient documentation

## 2018-08-16 DIAGNOSIS — Y939 Activity, unspecified: Secondary | ICD-10-CM | POA: Insufficient documentation

## 2018-08-16 DIAGNOSIS — Y990 Civilian activity done for income or pay: Secondary | ICD-10-CM | POA: Insufficient documentation

## 2018-08-16 DIAGNOSIS — Y929 Unspecified place or not applicable: Secondary | ICD-10-CM | POA: Insufficient documentation

## 2018-08-16 DIAGNOSIS — S51811A Laceration without foreign body of right forearm, initial encounter: Secondary | ICD-10-CM | POA: Insufficient documentation

## 2018-08-16 DIAGNOSIS — F17200 Nicotine dependence, unspecified, uncomplicated: Secondary | ICD-10-CM | POA: Insufficient documentation

## 2018-08-16 NOTE — ED Triage Notes (Signed)
Pt arrives ambulatory to triage. Reports he was helping someone unload a [employer] truck and a piece of metal fell out, striking his right arm. Lac to the right forearm (pt placed butterfly strips prior to arrival, and a smaller lac to the right elbow). Unknown tetanus.

## 2018-08-17 ENCOUNTER — Emergency Department (HOSPITAL_COMMUNITY)
Admission: EM | Admit: 2018-08-17 | Discharge: 2018-08-17 | Disposition: A | Discharge status: Home / Self Care | Payer: PRIVATE HEALTH INSURANCE | Attending: Emergency Medicine | Admitting: Emergency Medicine

## 2018-08-17 DIAGNOSIS — S51811A Laceration without foreign body of right forearm, initial encounter: Secondary | ICD-10-CM

## 2018-08-17 MED ORDER — TETANUS-DIPHTH-ACELL PERTUSSIS 5-2.5-18.5 LF-MCG/0.5 IM SUSP
0.5000 mL | Freq: Once | INTRAMUSCULAR | Status: AC
  Administered 2018-08-17: 0.5 mL via INTRAMUSCULAR
  Filled 2018-08-17: qty 0.5
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
## 2018-09-25 ENCOUNTER — Other Ambulatory Visit: Payer: Self-pay

## 2018-09-25 ENCOUNTER — Encounter (HOSPITAL_COMMUNITY): Payer: Self-pay | Admitting: Emergency Medicine

## 2018-09-25 ENCOUNTER — Emergency Department (HOSPITAL_COMMUNITY)
Admission: EM | Admit: 2018-09-25 | Discharge: 2018-09-25 | Disposition: A | Discharge status: Home / Self Care | Payer: Self-pay | Attending: Emergency Medicine | Admitting: Emergency Medicine

## 2018-09-25 DIAGNOSIS — G8929 Other chronic pain: Secondary | ICD-10-CM | POA: Insufficient documentation

## 2018-09-25 DIAGNOSIS — F17210 Nicotine dependence, cigarettes, uncomplicated: Secondary | ICD-10-CM | POA: Insufficient documentation

## 2018-09-25 DIAGNOSIS — M25562 Pain in left knee: Secondary | ICD-10-CM | POA: Insufficient documentation

## 2018-09-25 DIAGNOSIS — F1210 Cannabis abuse, uncomplicated: Secondary | ICD-10-CM | POA: Insufficient documentation

## 2018-09-25 MED ORDER — NAPROXEN 500 MG PO TABS: 500.0000 mg | ORAL_TABLET | Freq: Two times a day (BID) | ORAL | 0 refills | Status: DC

## 2018-09-25 NOTE — ED Triage Notes (Signed)
Pt states he had a left knee injury 3 months ago, pain was relieved, then he was crawling under a house to day and his left knee began hurting again, now has pain with ROM.
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
## 2018-09-25 NOTE — Discharge Instructions (Signed)
Return to ED for worsening symptoms, injuries or falls, red hot or tender joint, numbness in arms or legs.

## 2018-12-11 ENCOUNTER — Emergency Department (HOSPITAL_COMMUNITY): Admission: EM | Admit: 2018-12-11 | Discharge: 2018-12-11 | Discharge status: Against Medical Advice | Payer: Self-pay

## 2018-12-11 NOTE — ED Notes (Signed)
Pt called x 2 with no answer  

## 2018-12-11 NOTE — ED Notes (Signed)
Pt called x1 for vitals. No answer 

## 2018-12-18 ENCOUNTER — Emergency Department (HOSPITAL_COMMUNITY)
Admission: EM | Admit: 2018-12-18 | Discharge: 2018-12-18 | Disposition: A | Discharge status: Home / Self Care | Payer: Self-pay

## 2018-12-18 NOTE — ED Notes (Signed)
Pt called x1 for vitals assessment. No answer.

## 2018-12-22 ENCOUNTER — Other Ambulatory Visit: Payer: Self-pay

## 2018-12-22 ENCOUNTER — Emergency Department (HOSPITAL_COMMUNITY)
Admission: EM | Admit: 2018-12-22 | Discharge: 2018-12-22 | Disposition: A | Discharge status: Home / Self Care | Payer: Self-pay | Attending: Emergency Medicine | Admitting: Emergency Medicine

## 2018-12-22 ENCOUNTER — Encounter (HOSPITAL_COMMUNITY): Payer: Self-pay

## 2018-12-22 ENCOUNTER — Emergency Department (HOSPITAL_COMMUNITY): Payer: Self-pay

## 2018-12-22 DIAGNOSIS — M25562 Pain in left knee: Secondary | ICD-10-CM | POA: Insufficient documentation

## 2018-12-22 DIAGNOSIS — Z5321 Procedure and treatment not carried out due to patient leaving prior to being seen by health care provider: Secondary | ICD-10-CM | POA: Insufficient documentation

## 2018-12-22 NOTE — ED Notes (Signed)
No answer x 2 in WR. 

## 2018-12-22 NOTE — ED Triage Notes (Signed)
Pt reports L knee pain. He states that he is unable to extend to make his leg straight. Pt ambulatory. States that he was injured at work about 2 weeks ago.

## 2018-12-24 ENCOUNTER — Other Ambulatory Visit: Payer: Self-pay

## 2018-12-24 ENCOUNTER — Emergency Department (HOSPITAL_COMMUNITY)
Admission: EM | Admit: 2018-12-24 | Discharge: 2018-12-24 | Disposition: A | Discharge status: Home / Self Care | Payer: Self-pay | Attending: Emergency Medicine | Admitting: Emergency Medicine

## 2018-12-24 ENCOUNTER — Encounter (HOSPITAL_COMMUNITY): Payer: Self-pay

## 2018-12-24 DIAGNOSIS — Z79899 Other long term (current) drug therapy: Secondary | ICD-10-CM | POA: Insufficient documentation

## 2018-12-24 DIAGNOSIS — F17200 Nicotine dependence, unspecified, uncomplicated: Secondary | ICD-10-CM | POA: Insufficient documentation

## 2018-12-24 DIAGNOSIS — M25562 Pain in left knee: Secondary | ICD-10-CM | POA: Insufficient documentation

## 2018-12-24 MED ORDER — MELOXICAM 7.5 MG PO TABS: 15.0000 mg | ORAL_TABLET | Freq: Every day | ORAL | 0 refills | Status: DC
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
## 2018-12-24 NOTE — Discharge Instructions (Addendum)
Take the prescribed medication as directed.  Wear knee sleeve when up and moving about.  If at home, try to ice and elevate knee as this will help with swelling. Follow-up with orthopedics if ongoing issues. Return to the ED for new or worsening symptoms.

## 2019-01-10 DIAGNOSIS — Z79899 Other long term (current) drug therapy: Secondary | ICD-10-CM | POA: Insufficient documentation

## 2019-01-10 DIAGNOSIS — M25462 Effusion, left knee: Secondary | ICD-10-CM | POA: Insufficient documentation

## 2019-01-10 DIAGNOSIS — F17200 Nicotine dependence, unspecified, uncomplicated: Secondary | ICD-10-CM | POA: Insufficient documentation

## 2019-01-11 ENCOUNTER — Encounter (HOSPITAL_COMMUNITY): Payer: Self-pay

## 2019-01-11 ENCOUNTER — Other Ambulatory Visit: Payer: Self-pay

## 2019-01-11 ENCOUNTER — Emergency Department (HOSPITAL_COMMUNITY)
Admission: EM | Admit: 2019-01-11 | Discharge: 2019-01-11 | Disposition: A | Discharge status: Home / Self Care | Payer: Self-pay | Attending: Emergency Medicine | Admitting: Emergency Medicine

## 2019-01-11 DIAGNOSIS — M25562 Pain in left knee: Secondary | ICD-10-CM

## 2019-01-11 DIAGNOSIS — M25462 Effusion, left knee: Secondary | ICD-10-CM

## 2019-01-11 MED ORDER — MELOXICAM 7.5 MG PO TABS: 15.0000 mg | ORAL_TABLET | Freq: Every day | ORAL | 0 refills | Status: DC

## 2019-01-11 NOTE — ED Triage Notes (Signed)
Pt reports continued L knee pain. Ambulatory, but limping. The RN had to wake pt up to triage him. A&Ox4. No new injury.

## 2019-01-11 NOTE — Discharge Instructions (Signed)
Take Mobic as directed. Ice and elevate to reduce swelling of the knee. Rest the knee as much as possible. Follow up with Dr. Luiz BlareGraves for further management of recurrent knee pain and swelling.

## 2019-01-11 NOTE — ED Notes (Signed)
Pt asleep in waiting room when called to update vital signs.
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
## 2019-01-28 ENCOUNTER — Other Ambulatory Visit: Payer: Self-pay

## 2019-01-28 ENCOUNTER — Encounter (HOSPITAL_COMMUNITY): Payer: Self-pay | Admitting: *Deleted

## 2019-01-28 ENCOUNTER — Emergency Department (HOSPITAL_COMMUNITY)
Admission: EM | Admit: 2019-01-28 | Discharge: 2019-01-29 | Disposition: A | Discharge status: Home / Self Care | Payer: Self-pay | Attending: Emergency Medicine | Admitting: Emergency Medicine

## 2019-01-28 DIAGNOSIS — F17210 Nicotine dependence, cigarettes, uncomplicated: Secondary | ICD-10-CM | POA: Insufficient documentation

## 2019-01-28 DIAGNOSIS — R0789 Other chest pain: Secondary | ICD-10-CM | POA: Insufficient documentation

## 2019-01-28 DIAGNOSIS — Z79899 Other long term (current) drug therapy: Secondary | ICD-10-CM | POA: Insufficient documentation

## 2019-01-28 MED ORDER — LIDOCAINE 5 % EX PTCH
1.0000 | MEDICATED_PATCH | CUTANEOUS | Status: DC
  Administered 2019-01-29: 1 via TRANSDERMAL
  Filled 2019-01-28: qty 1

## 2019-01-28 MED ORDER — KETOROLAC TROMETHAMINE 30 MG/ML IJ SOLN
60.0000 mg | Freq: Once | INTRAMUSCULAR | Status: AC
  Administered 2019-01-29: 60 mg via INTRAMUSCULAR
  Filled 2019-01-28: qty 2
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
## 2019-01-28 NOTE — ED Triage Notes (Addendum)
Pt c/o left rib pain x 1 week.  Pt stated "I can't keep working like this.  My feet are numb.  My ribs hurt.  Today when I was emptying trash, I had to adjust to be able to throw it in because of my ribs.  I'm homeless.  If someone doesn't help me I'm going to the news and exploit it."

## 2019-01-29 ENCOUNTER — Emergency Department (HOSPITAL_COMMUNITY): Payer: Self-pay

## 2019-01-29 NOTE — ED Notes (Signed)
Patient ambulated to X-ray 

## 2019-01-29 NOTE — Discharge Instructions (Addendum)
You may alternate Tylenol 1000 mg every 6 hours as needed for pain and Ibuprofen 800 mg every 8 hours as needed for pain.  Please take Ibuprofen with food.  These medications are found over-the-counter.   You may use salonpas patches that are over-the-counter to help with your left-sided rib pain.    Steps to find a Primary Care Provider (PCP):  Call [phone] or [phone] to access "[facility] Find a Doctor Service."  2.  You may also go on the [facility] website at [email or web address]  3.  [facility] also frequently accepts new patients.  [facility]  [address] [place] [address] [phone]  4.  There are also multiple [facility], [facility] and [facility] practices throughout the [place] that are frequently accepting new patients. You may find a clinic that is close to your home and contact them.  [facility] [email or web address] [phone]  [email or web address]  [facility] [email or web address] [phone]  [facility] [email or web address] [phone]  5.  Local Health Departments also can provide primary care services.  [facility]  [address] [place] [address] [phone]  [facility] Department [address] [place] [address] [phone]  [facility] [address] [place] [address]  [place] [address] [phone]

## 2019-06-20 IMAGING — CR DG RIBS W/ CHEST 3+V*L*
3 series · 3 of 3 positions shown · non-contrast
Comparison: Chest radiograph dated 03/31/2017

CLINICAL DATA: 42-year-old male with left anterior rib pain. No
injury.

EXAM:
LEFT RIBS AND CHEST - 3+ VIEW

[w chest pa]
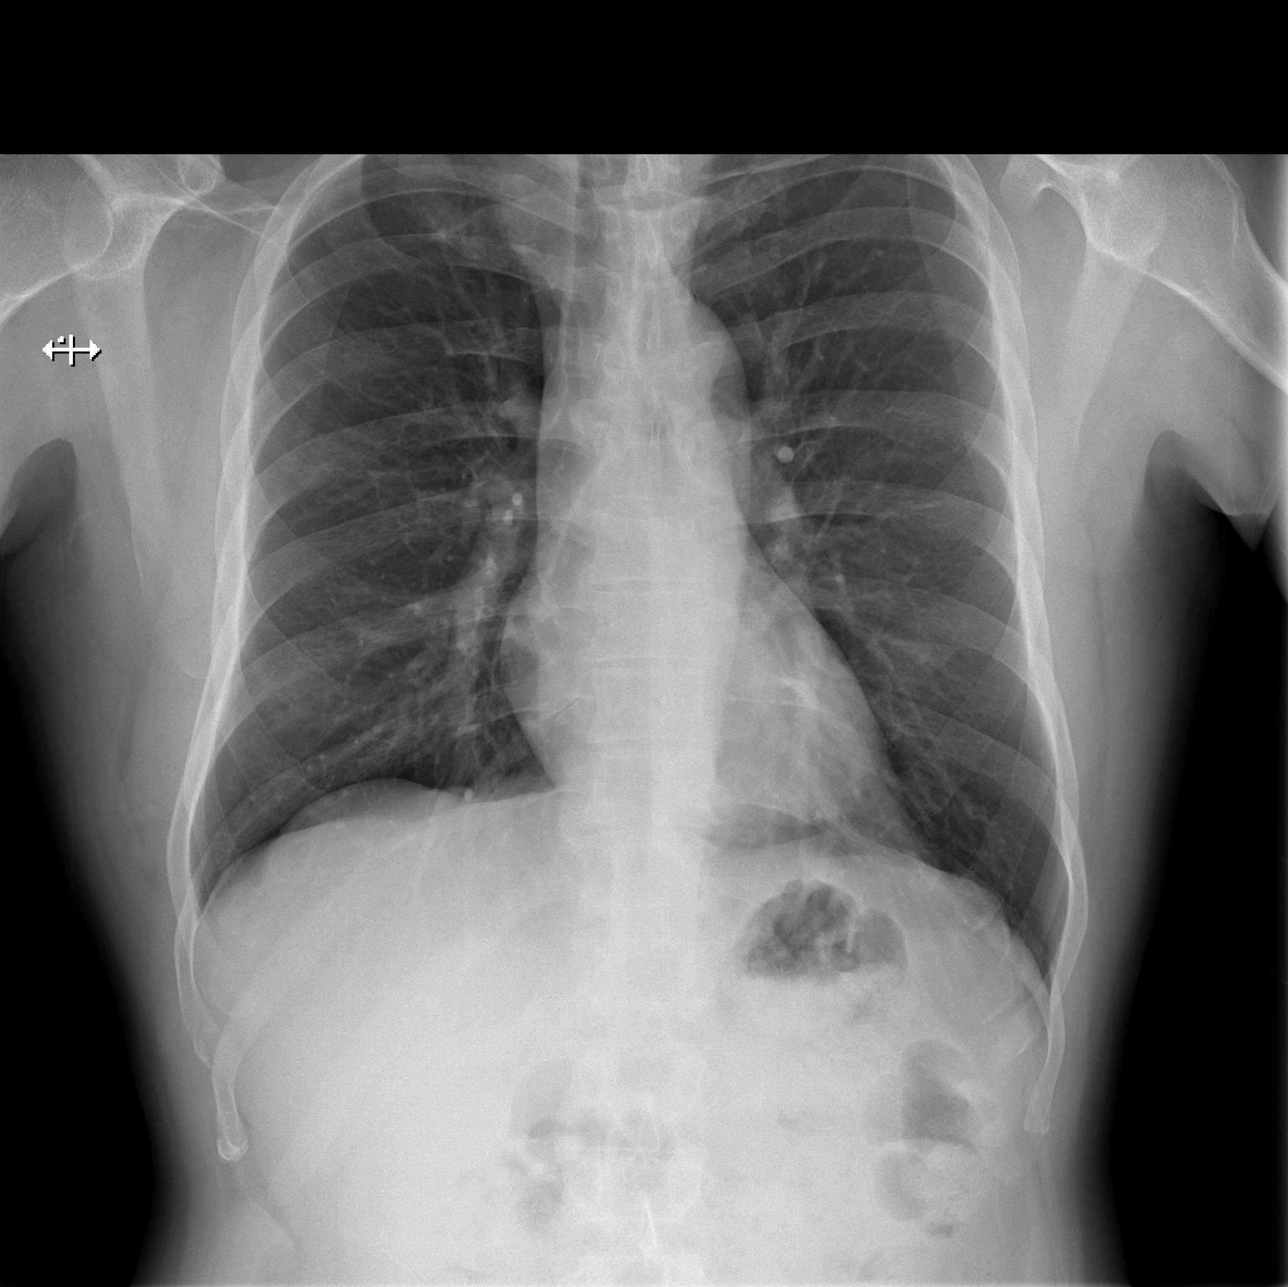

[w ribs obl left (1 of 2)]
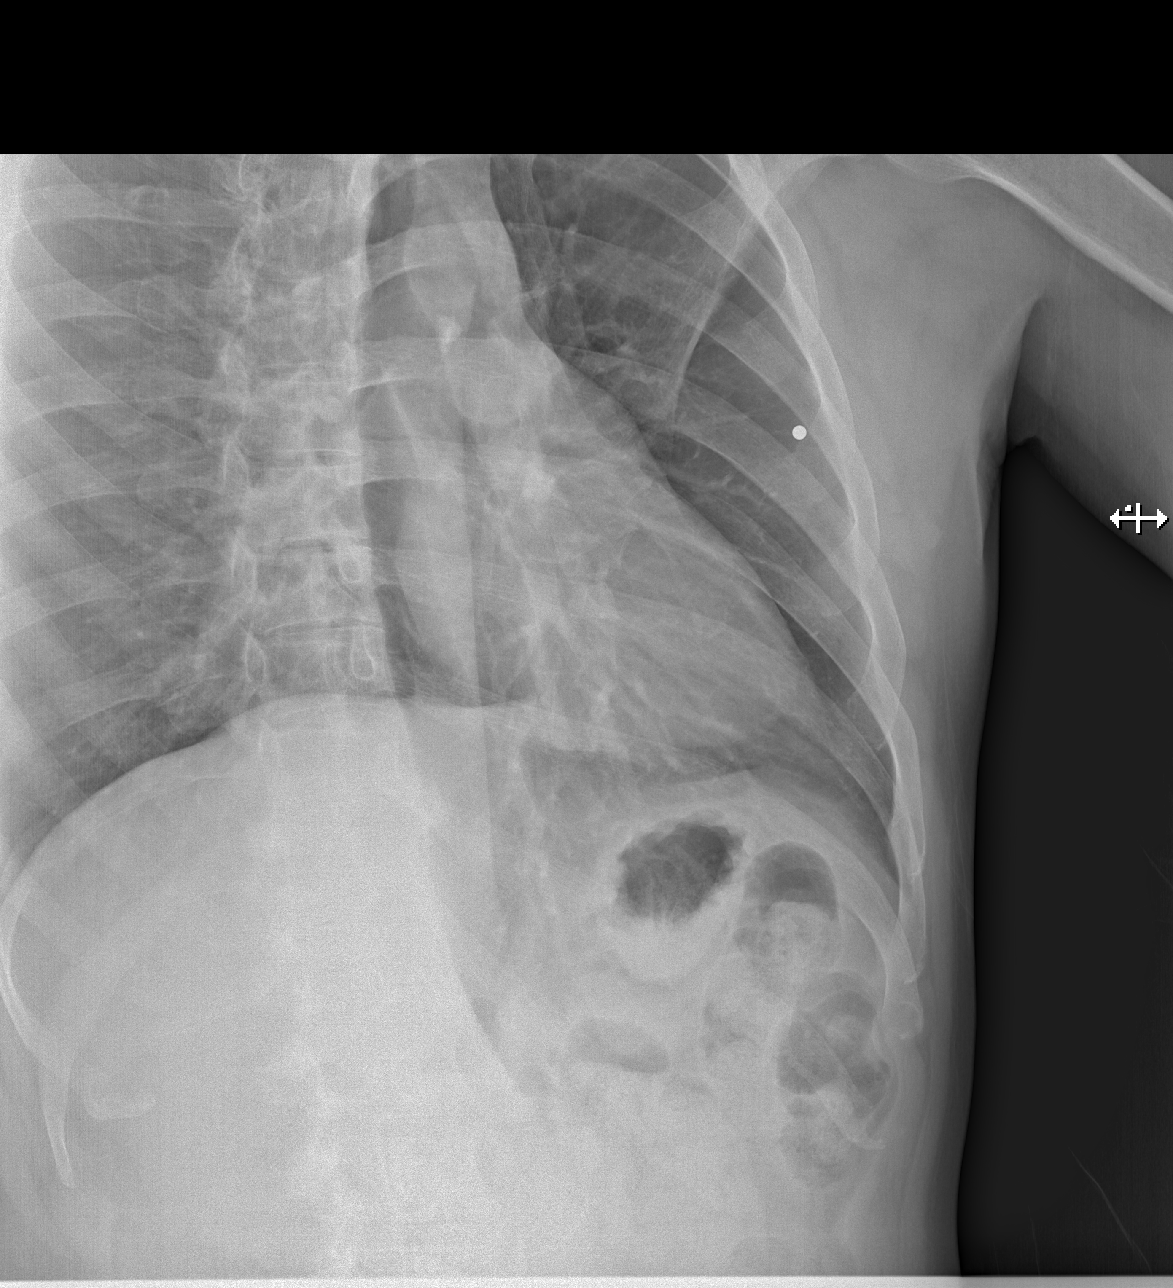

[w ribs obl left (2 of 2)]
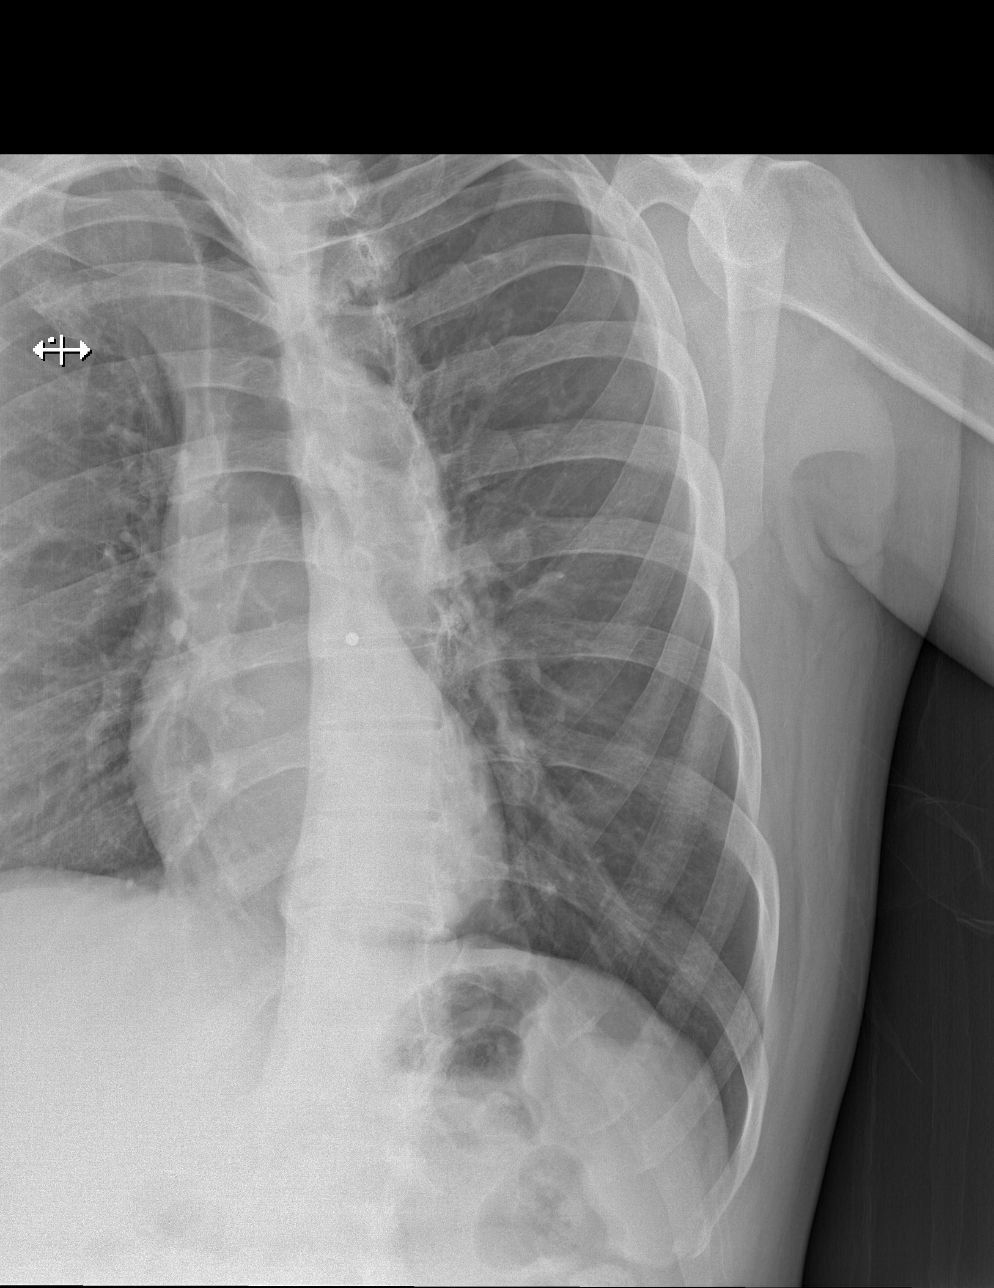

[3 of 3 positions shown; findings below may reference images not displayed]

FINDINGS: The lungs are clear. There is no pleural effusion or pneumothorax.
The cardiac silhouette is within normal limits. No acute osseous
pathology.
IMPRESSION: Negative.

## 2019-12-26 ENCOUNTER — Encounter (HOSPITAL_COMMUNITY): Payer: Self-pay

## 2019-12-26 ENCOUNTER — Emergency Department (HOSPITAL_COMMUNITY)
Admission: EM | Admit: 2019-12-26 | Discharge: 2019-12-27 | Disposition: A | Discharge status: Home / Self Care | Payer: Self-pay | Attending: Emergency Medicine | Admitting: Emergency Medicine

## 2019-12-26 DIAGNOSIS — F10920 Alcohol use, unspecified with intoxication, uncomplicated: Secondary | ICD-10-CM | POA: Insufficient documentation

## 2019-12-26 DIAGNOSIS — F17200 Nicotine dependence, unspecified, uncomplicated: Secondary | ICD-10-CM | POA: Insufficient documentation

## 2019-12-26 DIAGNOSIS — Y905 Blood alcohol level of 100-119 mg/100 ml: Secondary | ICD-10-CM | POA: Insufficient documentation

## 2019-12-26 HISTORY — DX: Unspecified convulsions: R56.9

## 2019-12-26 LAB — CBC WITH DIFFERENTIAL/PLATELET
Abs Immature Granulocytes: 0.04 10*3/uL (ref 0.00–0.07)
Basophils Absolute: 0.1 10*3/uL (ref 0.0–0.1)
Basophils Relative: 1 %
Eosinophils Absolute: 0.2 10*3/uL (ref 0.0–0.5)
Eosinophils Relative: 1 %
HCT: 45 % (ref 39.0–52.0)
Hemoglobin: 14.8 g/dL (ref 13.0–17.0)
Immature Granulocytes: 0 %
Lymphocytes Relative: 49 %
Lymphs Abs: 5.7 10*3/uL — ABNORMAL HIGH (ref 0.7–4.0)
MCH: 29.5 pg (ref 26.0–34.0)
MCHC: 32.9 g/dL (ref 30.0–36.0)
MCV: 89.8 fL (ref 80.0–100.0)
Monocytes Absolute: 0.9 10*3/uL (ref 0.1–1.0)
Monocytes Relative: 8 %
Neutro Abs: 4.8 10*3/uL (ref 1.7–7.7)
Neutrophils Relative %: 41 %
Platelets: 249 10*3/uL (ref 150–400)
RBC: 5.01 MIL/uL (ref 4.22–5.81)
RDW: 13.5 % (ref 11.5–15.5)
WBC: 11.7 10*3/uL — ABNORMAL HIGH (ref 4.0–10.5)
nRBC: 0 % (ref 0.0–0.2)

## 2019-12-26 LAB — COMPREHENSIVE METABOLIC PANEL
ALT: 40 U/L (ref 0–44)
AST: 49 U/L — ABNORMAL HIGH (ref 15–41)
Albumin: 4.3 g/dL (ref 3.5–5.0)
Alkaline Phosphatase: 78 U/L (ref 38–126)
Anion gap: 14 (ref 5–15)
BUN: 6 mg/dL (ref 6–20)
CO2: 21 mmol/L — ABNORMAL LOW (ref 22–32)
Calcium: 8.9 mg/dL (ref 8.9–10.3)
Chloride: 101 mmol/L (ref 98–111)
Creatinine, Ser: 1.07 mg/dL (ref 0.61–1.24)
GFR calc Af Amer: >60 mL/min (ref >60)
GFR calc non Af Amer: >60 mL/min (ref >60)
Glucose, Bld: 162 mg/dL — ABNORMAL HIGH (ref 70–99)
Potassium: 3.8 mmol/L (ref 3.5–5.1)
Sodium: 136 mmol/L (ref 135–145)
Total Bilirubin: 1.1 mg/dL (ref 0.3–1.2)
Total Protein: 8.2 g/dL — ABNORMAL HIGH (ref 6.5–8.1)

## 2019-12-26 LAB — ETHANOL: Alcohol, Ethyl (B): 104 mg/dL — ABNORMAL HIGH (ref <10)

## 2019-12-26 LAB — LIPASE, BLOOD: Lipase: 24 U/L (ref 11–51)

## 2019-12-26 MED ORDER — ONDANSETRON HCL 4 MG/2ML IJ SOLN
4.0000 mg | Freq: Once | INTRAMUSCULAR | Status: AC
  Administered 2019-12-26: 18:00:00 4 mg via INTRAVENOUS
  Filled 2019-12-26: qty 2

## 2019-12-26 MED ORDER — SODIUM CHLORIDE 0.9 % IV SOLN: INTRAVENOUS | Status: DC

## 2019-12-26 MED ORDER — SODIUM CHLORIDE 0.9 % IV BOLUS
1000.0000 mL | Freq: Once | INTRAVENOUS | Status: AC
  Administered 2019-12-26: 1000 mL via INTRAVENOUS
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
## 2019-12-27 LAB — RAPID URINE DRUG SCREEN, HOSP PERFORMED
Amphetamines: NOT DETECTED
Barbiturates: NOT DETECTED
Benzodiazepines: NOT DETECTED
Cocaine: POSITIVE — AB
Opiates: NOT DETECTED
Tetrahydrocannabinol: POSITIVE — AB

## 2019-12-27 NOTE — ED Notes (Signed)
Pt left backpack. Attempted to call number on file [phone] but received busy signal.

## 2019-12-27 NOTE — ED Provider Notes (Signed)
4:54 AM Patient awake, alert, ambulatory, ready for discharge.     Chinyere Galiano, Jonny Ruiz, MD 12/27/19 [phone]

## 2020-07-20 ENCOUNTER — Other Ambulatory Visit: Payer: Self-pay

## 2020-07-20 ENCOUNTER — Encounter (HOSPITAL_COMMUNITY): Payer: Self-pay | Admitting: Emergency Medicine

## 2020-07-20 ENCOUNTER — Emergency Department (HOSPITAL_COMMUNITY)
Admission: EM | Admit: 2020-07-20 | Discharge: 2020-07-21 | Disposition: A | Discharge status: Home / Self Care | Payer: Self-pay | Attending: Emergency Medicine | Admitting: Emergency Medicine

## 2020-07-20 DIAGNOSIS — Z5321 Procedure and treatment not carried out due to patient leaving prior to being seen by health care provider: Secondary | ICD-10-CM | POA: Insufficient documentation

## 2020-07-20 DIAGNOSIS — R109 Unspecified abdominal pain: Secondary | ICD-10-CM | POA: Insufficient documentation

## 2020-07-20 LAB — COMPREHENSIVE METABOLIC PANEL
ALT: 39 U/L (ref 0–44)
AST: 47 U/L — ABNORMAL HIGH (ref 15–41)
Albumin: 3.9 g/dL (ref 3.5–5.0)
Alkaline Phosphatase: 78 U/L (ref 38–126)
Anion gap: 7 (ref 5–15)
BUN: 8 mg/dL (ref 6–20)
CO2: 28 mmol/L (ref 22–32)
Calcium: 9.3 mg/dL (ref 8.9–10.3)
Chloride: 103 mmol/L (ref 98–111)
Creatinine, Ser: 0.86 mg/dL (ref 0.61–1.24)
GFR calc Af Amer: >60 mL/min (ref >60)
GFR calc non Af Amer: >60 mL/min (ref >60)
Glucose, Bld: 119 mg/dL — ABNORMAL HIGH (ref 70–99)
Potassium: 4.8 mmol/L (ref 3.5–5.1)
Sodium: 138 mmol/L (ref 135–145)
Total Bilirubin: 2 mg/dL — ABNORMAL HIGH (ref 0.3–1.2)
Total Protein: 8.3 g/dL — ABNORMAL HIGH (ref 6.5–8.1)

## 2020-07-20 LAB — CBC
HCT: 44.9 % (ref 39.0–52.0)
Hemoglobin: 14.6 g/dL (ref 13.0–17.0)
MCH: 30.2 pg (ref 26.0–34.0)
MCHC: 32.5 g/dL (ref 30.0–36.0)
MCV: 93 fL (ref 80.0–100.0)
Platelets: 198 10*3/uL (ref 150–400)
RBC: 4.83 MIL/uL (ref 4.22–5.81)
RDW: 13.9 % (ref 11.5–15.5)
WBC: 7.2 10*3/uL (ref 4.0–10.5)
nRBC: 0 % (ref 0.0–0.2)

## 2020-07-20 LAB — LIPASE, BLOOD: Lipase: 30 U/L (ref 11–51)

## 2020-07-20 LAB — ETHANOL: Alcohol, Ethyl (B): <10 mg/dL (ref <10)

## 2020-07-20 NOTE — ED Triage Notes (Addendum)
Per [facility] pt from [employer] for abd pains that started earlier today. Tenderness with palpation. Drinks around 3 beers a day.  Vitals: 142/93, 60hr, 16r, 99% cbg 131

## 2020-07-21 NOTE — ED Notes (Signed)
Eloped from waiting room. Called 3x.

## 2021-05-08 ENCOUNTER — Other Ambulatory Visit: Payer: Self-pay

## 2021-05-08 ENCOUNTER — Emergency Department (HOSPITAL_COMMUNITY)
Admission: EM | Admit: 2021-05-08 | Discharge: 2021-05-08 | Disposition: A | Discharge status: Home / Self Care | Payer: Self-pay | Attending: Emergency Medicine | Admitting: Emergency Medicine

## 2021-05-08 DIAGNOSIS — F17200 Nicotine dependence, unspecified, uncomplicated: Secondary | ICD-10-CM | POA: Insufficient documentation

## 2021-05-08 DIAGNOSIS — K047 Periapical abscess without sinus: Secondary | ICD-10-CM | POA: Insufficient documentation

## 2021-05-08 MED ORDER — DICLOFENAC SODIUM 75 MG PO TBEC: 75.0000 mg | DELAYED_RELEASE_TABLET | Freq: Two times a day (BID) | ORAL | 0 refills | Status: DC

## 2021-05-08 MED ORDER — AMOXICILLIN 500 MG PO CAPS: 500.0000 mg | ORAL_CAPSULE | Freq: Three times a day (TID) | ORAL | 0 refills | Status: DC
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
## 2021-05-08 NOTE — ED Triage Notes (Signed)
Patient reports swelling in right side of face, tooth abscess. Pain rated 6/10. Swelling and pain started yesterday.

## 2021-05-08 NOTE — Discharge Instructions (Signed)
Return if any problems.

## 2021-05-08 NOTE — Care Management (Signed)
Patient stated he did not have money to pay for medication MATCH provided to patient and called in to [employer] and [facility] [employer]. Called patient, phone not working, called  [place] phone mail box full.

## 2021-05-31 ENCOUNTER — Encounter (HOSPITAL_COMMUNITY): Payer: Self-pay

## 2021-05-31 ENCOUNTER — Other Ambulatory Visit: Payer: Self-pay

## 2021-05-31 ENCOUNTER — Emergency Department (HOSPITAL_COMMUNITY): Payer: Self-pay

## 2021-05-31 ENCOUNTER — Emergency Department (HOSPITAL_COMMUNITY)
Admission: EM | Admit: 2021-05-31 | Discharge: 2021-05-31 | Disposition: A | Discharge status: Home / Self Care | Payer: Self-pay | Attending: Emergency Medicine | Admitting: Emergency Medicine

## 2021-05-31 DIAGNOSIS — Z5321 Procedure and treatment not carried out due to patient leaving prior to being seen by health care provider: Secondary | ICD-10-CM | POA: Insufficient documentation

## 2021-05-31 DIAGNOSIS — R000 Tachycardia, unspecified: Secondary | ICD-10-CM | POA: Insufficient documentation

## 2021-05-31 LAB — BASIC METABOLIC PANEL
Anion gap: 7 (ref 5–15)
BUN: 10 mg/dL (ref 6–20)
CO2: 25 mmol/L (ref 22–32)
Calcium: 8.9 mg/dL (ref 8.9–10.3)
Chloride: 104 mmol/L (ref 98–111)
Creatinine, Ser: 0.8 mg/dL (ref 0.61–1.24)
GFR, Estimated: >60 mL/min (ref >60)
Glucose, Bld: 89 mg/dL (ref 70–99)
Potassium: 3.9 mmol/L (ref 3.5–5.1)
Sodium: 136 mmol/L (ref 135–145)

## 2021-05-31 LAB — CBC
HCT: 37.9 % — ABNORMAL LOW (ref 39.0–52.0)
Hemoglobin: 12.8 g/dL — ABNORMAL LOW (ref 13.0–17.0)
MCH: 30.4 pg (ref 26.0–34.0)
MCHC: 33.8 g/dL (ref 30.0–36.0)
MCV: 90 fL (ref 80.0–100.0)
Platelets: 192 10*3/uL (ref 150–400)
RBC: 4.21 MIL/uL — ABNORMAL LOW (ref 4.22–5.81)
RDW: 13.7 % (ref 11.5–15.5)
WBC: 5.4 10*3/uL (ref 4.0–10.5)
nRBC: 0 % (ref 0.0–0.2)

## 2021-05-31 LAB — TROPONIN I (HIGH SENSITIVITY): Troponin I (High Sensitivity): 2 ng/L (ref <18)

## 2021-05-31 LAB — TSH: TSH: 0.594 u[IU]/mL (ref 0.350–4.500)

## 2021-05-31 LAB — MAGNESIUM: Magnesium: 2.1 mg/dL (ref 1.7–2.4)

## 2021-05-31 NOTE — ED Triage Notes (Addendum)
Patient states, "I know that my heart has been racing for the past 3 weeks. I know I am dying. I want a heart doctor."  Patient states he was having chest pain when he went to work at [employer], but none at this time.  HR in triage-85 and regular.
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
## 2021-05-31 NOTE — ED Notes (Signed)
Patient told screeners that he was going to the cafeteria.

## 2021-09-19 ENCOUNTER — Emergency Department (HOSPITAL_COMMUNITY)
Admission: EM | Admit: 2021-09-19 | Discharge: 2021-09-19 | Disposition: A | Discharge status: Home / Self Care | Payer: Self-pay | Attending: Emergency Medicine | Admitting: Emergency Medicine

## 2021-09-19 ENCOUNTER — Emergency Department (HOSPITAL_COMMUNITY): Payer: Self-pay

## 2021-09-19 ENCOUNTER — Encounter (HOSPITAL_COMMUNITY): Payer: Self-pay | Admitting: Emergency Medicine

## 2021-09-19 ENCOUNTER — Other Ambulatory Visit: Payer: Self-pay

## 2021-09-19 DIAGNOSIS — S20211A Contusion of right front wall of thorax, initial encounter: Secondary | ICD-10-CM

## 2021-09-19 DIAGNOSIS — S0083XA Contusion of other part of head, initial encounter: Secondary | ICD-10-CM

## 2021-09-19 DIAGNOSIS — F17210 Nicotine dependence, cigarettes, uncomplicated: Secondary | ICD-10-CM | POA: Insufficient documentation

## 2021-09-19 DIAGNOSIS — G930 Cerebral cysts: Secondary | ICD-10-CM

## 2021-09-19 MED ORDER — IBUPROFEN 200 MG PO TABS
600.0000 mg | ORAL_TABLET | Freq: Once | ORAL | Status: AC
  Administered 2021-09-19: 600 mg via ORAL
  Filled 2021-09-19: qty 3

## 2021-09-19 NOTE — ED Notes (Signed)
Pt discharged. Instructions given. AAOX4. Pt in no apparent distress with moderate pain. The opportunity to ask questions was provided.  
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
## 2021-09-19 NOTE — ED Notes (Signed)
Patient transported to CT 

## 2021-09-19 NOTE — Discharge Instructions (Signed)
You had a CT scan of your head performed today. It showed a cyst on your brain. It is very important that you follow-up with the neurosurgeon for further evaluation of this cyst.   You may take Tylenol or ibuprofen, available over-the-counter according to label instructions as needed for pain.   Get rechecked if you develop difficulty breathing, abdominal pain or new concerning symptoms.
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
## 2021-10-08 ENCOUNTER — Emergency Department (HOSPITAL_COMMUNITY): Admission: EM | Admit: 2021-10-08 | Discharge: 2021-10-08 | Discharge status: Against Medical Advice | Payer: Self-pay
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
## 2021-10-10 ENCOUNTER — Emergency Department (HOSPITAL_COMMUNITY): Admission: EM | Admit: 2021-10-10 | Discharge: 2021-10-10 | Discharge status: Against Medical Advice | Payer: Self-pay

## 2021-10-10 NOTE — ED Notes (Signed)
PT called for triage X3 with no response °

## 2021-10-10 NOTE — ED Triage Notes (Signed)
Unable to locate patient at waiting area multiple times by staff.  

## 2021-10-12 ENCOUNTER — Emergency Department (HOSPITAL_COMMUNITY): Payer: Self-pay

## 2021-10-12 ENCOUNTER — Emergency Department (HOSPITAL_COMMUNITY)
Admission: EM | Admit: 2021-10-12 | Discharge: 2021-10-13 | Disposition: A | Discharge status: Home / Self Care | Payer: Self-pay | Attending: Emergency Medicine | Admitting: Emergency Medicine

## 2021-10-12 ENCOUNTER — Other Ambulatory Visit: Payer: Self-pay

## 2021-10-12 ENCOUNTER — Encounter (HOSPITAL_COMMUNITY): Payer: Self-pay

## 2021-10-12 DIAGNOSIS — Y907 Blood alcohol level of 200-239 mg/100 ml: Secondary | ICD-10-CM | POA: Insufficient documentation

## 2021-10-12 DIAGNOSIS — R519 Headache, unspecified: Secondary | ICD-10-CM | POA: Insufficient documentation

## 2021-10-12 DIAGNOSIS — Z79899 Other long term (current) drug therapy: Secondary | ICD-10-CM | POA: Insufficient documentation

## 2021-10-12 DIAGNOSIS — R002 Palpitations: Secondary | ICD-10-CM | POA: Insufficient documentation

## 2021-10-12 DIAGNOSIS — K0889 Other specified disorders of teeth and supporting structures: Secondary | ICD-10-CM | POA: Insufficient documentation

## 2021-10-12 DIAGNOSIS — F17210 Nicotine dependence, cigarettes, uncomplicated: Secondary | ICD-10-CM | POA: Insufficient documentation

## 2021-10-12 NOTE — ED Triage Notes (Signed)
Pt reports having a headache or several days.

## 2021-10-12 NOTE — ED Provider Notes (Signed)
Emergency Medicine Provider Triage Evaluation Note  Goerge Mohr , a 45 y.o. male  was evaluated in triage.  Pt complains of palpitations & headache. Patient reports he has been drinking and smoking marijauna. He reports his heart is racing, no alleviating/aggravating factors. He also head a headache to the right side that is new, he reports having a brain cyst that he is suppose to see a neurosurgeon for but is not sure when.   Review of Systems  Positive: Headache, palpitations.  Negative: Fever, chest pain, dyspnea, vomiting, diarrhea,   Physical Exam  BP 118/90   Pulse 79   Temp 98.3 F (36.8 C) (Oral)   Resp 18   SpO2 97%  Gen:   Awake, no distress   Resp:  Normal effort  MSK:   Moves extremities without difficulty  Other:  Sensation grossly intact x 4. 5/5 strength with grip strength & plantar/dorsiflexion bilaterally. No facial droop. Ambulatory.   Medical Decision Making  Medically screening exam initiated at 11:07 PM.  Appropriate orders placed.  Tayvian Holycross was informed that the remainder of the evaluation will be completed by another provider, this initial triage assessment does not replace that evaluation, and the importance of remaining in the ED until their evaluation is complete.  Palpitations.    Desmond Lope 10/12/21 2310    Glynn Octave, MD 10/13/21 [phone]

## 2021-10-13 LAB — CBC
HCT: 37.2 % — ABNORMAL LOW (ref 39.0–52.0)
Hemoglobin: 12.5 g/dL — ABNORMAL LOW (ref 13.0–17.0)
MCH: 30.3 pg (ref 26.0–34.0)
MCHC: 33.6 g/dL (ref 30.0–36.0)
MCV: 90.3 fL (ref 80.0–100.0)
Platelets: 181 10*3/uL (ref 150–400)
RBC: 4.12 MIL/uL — ABNORMAL LOW (ref 4.22–5.81)
RDW: 14.3 % (ref 11.5–15.5)
WBC: 4.8 10*3/uL (ref 4.0–10.5)
nRBC: 0 % (ref 0.0–0.2)

## 2021-10-13 LAB — RAPID URINE DRUG SCREEN, HOSP PERFORMED
Amphetamines: NOT DETECTED
Barbiturates: NOT DETECTED
Benzodiazepines: NOT DETECTED
Cocaine: POSITIVE — AB
Opiates: NOT DETECTED
Tetrahydrocannabinol: POSITIVE — AB

## 2021-10-13 LAB — COMPREHENSIVE METABOLIC PANEL
ALT: 30 U/L (ref 0–44)
AST: 38 U/L (ref 15–41)
Albumin: 3.4 g/dL — ABNORMAL LOW (ref 3.5–5.0)
Alkaline Phosphatase: 68 U/L (ref 38–126)
Anion gap: 6 (ref 5–15)
BUN: 6 mg/dL (ref 6–20)
CO2: 27 mmol/L (ref 22–32)
Calcium: 8 mg/dL — ABNORMAL LOW (ref 8.9–10.3)
Chloride: 105 mmol/L (ref 98–111)
Creatinine, Ser: 0.63 mg/dL (ref 0.61–1.24)
GFR, Estimated: >60 mL/min (ref >60)
Glucose, Bld: 95 mg/dL (ref 70–99)
Potassium: 3.5 mmol/L (ref 3.5–5.1)
Sodium: 138 mmol/L (ref 135–145)
Total Bilirubin: 0.8 mg/dL (ref 0.3–1.2)
Total Protein: 7.2 g/dL (ref 6.5–8.1)

## 2021-10-13 LAB — ETHANOL: Alcohol, Ethyl (B): 234 mg/dL — ABNORMAL HIGH (ref <10)

## 2021-10-13 MED ORDER — PENICILLIN V POTASSIUM 500 MG PO TABS: 500.0000 mg | ORAL_TABLET | Freq: Four times a day (QID) | ORAL | 0 refills | Status: DC

## 2021-10-13 MED ORDER — PENICILLIN V POTASSIUM 500 MG PO TABS
500.0000 mg | ORAL_TABLET | Freq: Once | ORAL | Status: AC
  Administered 2021-10-13: 500 mg via ORAL
  Filled 2021-10-13: qty 1

## 2021-10-13 MED ORDER — SODIUM CHLORIDE 0.9 % IV BOLUS
1000.0000 mL | Freq: Once | INTRAVENOUS | Status: AC
  Administered 2021-10-13: 1000 mL via INTRAVENOUS

## 2021-10-13 MED ORDER — PENICILLIN V POTASSIUM 500 MG PO TABS: 500.0000 mg | ORAL_TABLET | Freq: Four times a day (QID) | ORAL | 0 refills | Status: AC

## 2021-10-13 NOTE — Discharge Instructions (Addendum)
Call one of the dentists offices provided to schedule an appointment for re-evaluation and further management within the next 48 hours.   I have prescribed you Penicillin VK which is an antibiotic to treat possible dental infection.   We have prescribed you new medication(s) today. Discuss the medications prescribed today with your pharmacist as they can have adverse effects and interactions with your other medicines including over the counter and prescribed medications. Seek medical evaluation if you start to experience new or abnormal symptoms after taking one of these medicines, seek care immediately if you start to experience difficulty breathing, feeling of your throat closing, facial swelling, or rash as these could be indications of a more serious allergic reaction  Take motrin/tylenol per over the counter dosing as needed for pain.   We have prescribed you new medication(s) today. Discuss the medications prescribed today with your pharmacist as they can have adverse effects and interactions with your other medicines including over the counter and prescribed medications. Seek medical evaluation if you start to experience new or abnormal symptoms after taking one of these medicines, seek care immediately if you start to experience difficulty breathing, feeling of your throat closing, facial swelling, or rash as these could be indications of a more serious allergic reaction  Your EKG showed some premature atrial complexes, these could be contributing to your abnormal heart sensations, your heart rate was normal, however you should follow-up with your primary care provider to discuss possible Holter monitoring, your labs showed mild anemia, have this rechecked by your primary care provider as well.   Follow-up with your neurosurgeon as scheduled.  If you start to experience and new or worsening symptoms return to the emergency department. If you start to experience chest pain, trouble breathing,  passing out, fever, chills, neck stiffness/pain, or inability to move your neck or open your mouth come back to the emergency department immediately.
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
## 2021-10-15 ENCOUNTER — Emergency Department (HOSPITAL_COMMUNITY): Admission: EM | Admit: 2021-10-15 | Discharge: 2021-10-15 | Discharge status: Against Medical Advice | Payer: Self-pay

## 2021-10-15 ENCOUNTER — Other Ambulatory Visit: Payer: Self-pay

## 2021-10-15 NOTE — ED Triage Notes (Addendum)
Patient upon attempting to triage him, became demanding and irate when attempting to discuss his care. Patient requesting a medication refill for an unknown medication that he states was on his discharge paperwork, but the section has been ripped out. Patient states that the nurse that saw him on 10/16 "lied" and that he wanted the nurses name for a complaint. Discussed with patient that there is no way to tell who saw him and "lied" to him. Patient then became belligerent, began cursing and threatening staff stating, "I hope you die," also stating, "I hope your mother dies," referring to this RN at the N word multiple times. Security called to assist patient with behavior. Patient escorted himself out of the department, continuing to curse at staff.

## 2022-01-18 ENCOUNTER — Emergency Department (HOSPITAL_COMMUNITY)
Admission: EM | Admit: 2022-01-18 | Discharge: 2022-01-18 | Discharge status: Against Medical Advice | Payer: 59 | Source: Home / Self Care

## 2022-01-20 ENCOUNTER — Emergency Department (HOSPITAL_COMMUNITY)
Admission: EM | Admit: 2022-01-20 | Discharge: 2022-01-20 | Discharge status: Against Medical Advice | Payer: Self-pay | Source: Home / Self Care

## 2022-01-25 ENCOUNTER — Encounter: Payer: Self-pay | Admitting: *Deleted
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
## 2022-01-26 ENCOUNTER — Encounter: Payer: Self-pay | Admitting: Physician Assistant

## 2022-01-26 ENCOUNTER — Other Ambulatory Visit: Payer: Self-pay | Admitting: Critical Care Medicine

## 2022-01-26 DIAGNOSIS — Q046 Congenital cerebral cysts: Secondary | ICD-10-CM
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
## 2022-02-09 ENCOUNTER — Ambulatory Visit (HOSPITAL_BASED_OUTPATIENT_CLINIC_OR_DEPARTMENT_OTHER): Payer: 59 | Admitting: Critical Care Medicine

## 2022-02-09 ENCOUNTER — Encounter: Payer: Self-pay | Admitting: Critical Care Medicine

## 2022-02-09 DIAGNOSIS — Z91199 Patient's noncompliance with other medical treatment and regimen due to unspecified reason: Secondary | ICD-10-CM
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
## 2022-02-09 NOTE — Progress Notes (Signed)
This patient was a no-show visit ?I tried to call the patient but his cell phone would not connect it would not allow me to leave a message ?

## 2022-02-16 ENCOUNTER — Inpatient Hospital Stay (INDEPENDENT_AMBULATORY_CARE_PROVIDER_SITE_OTHER): Payer: 59 | Admitting: Primary Care

## 2022-02-25 ENCOUNTER — Emergency Department (HOSPITAL_COMMUNITY): Payer: 59

## 2022-02-25 ENCOUNTER — Encounter (HOSPITAL_COMMUNITY): Payer: Self-pay

## 2022-02-25 ENCOUNTER — Emergency Department (HOSPITAL_COMMUNITY)
Admission: EM | Admit: 2022-02-25 | Discharge: 2022-02-25 | Disposition: A | Discharge status: Home / Self Care | Payer: 59 | Attending: Emergency Medicine | Admitting: Emergency Medicine

## 2022-02-25 ENCOUNTER — Other Ambulatory Visit: Payer: Self-pay

## 2022-02-25 DIAGNOSIS — R930 Abnormal findings on diagnostic imaging of skull and head, not elsewhere classified: Secondary | ICD-10-CM | POA: Insufficient documentation

## 2022-02-25 DIAGNOSIS — R5383 Other fatigue: Secondary | ICD-10-CM | POA: Diagnosis not present

## 2022-02-25 DIAGNOSIS — G930 Cerebral cysts: Secondary | ICD-10-CM | POA: Diagnosis not present

## 2022-02-25 DIAGNOSIS — R519 Headache, unspecified: Secondary | ICD-10-CM | POA: Diagnosis present

## 2022-02-25 LAB — CBC WITH DIFFERENTIAL/PLATELET
Abs Immature Granulocytes: 0.01 10*3/uL (ref 0.00–0.07)
Basophils Absolute: 0 10*3/uL (ref 0.0–0.1)
Basophils Relative: 1 %
Eosinophils Absolute: 0.1 10*3/uL (ref 0.0–0.5)
Eosinophils Relative: 1 %
HCT: 37.9 % — ABNORMAL LOW (ref 39.0–52.0)
Hemoglobin: 12.4 g/dL — ABNORMAL LOW (ref 13.0–17.0)
Immature Granulocytes: 0 %
Lymphocytes Relative: 39 %
Lymphs Abs: 1.9 10*3/uL (ref 0.7–4.0)
MCH: 29.7 pg (ref 26.0–34.0)
MCHC: 32.7 g/dL (ref 30.0–36.0)
MCV: 90.9 fL (ref 80.0–100.0)
Monocytes Absolute: 0.5 10*3/uL (ref 0.1–1.0)
Monocytes Relative: 10 %
Neutro Abs: 2.4 10*3/uL (ref 1.7–7.7)
Neutrophils Relative %: 49 %
Platelets: 209 10*3/uL (ref 150–400)
RBC: 4.17 MIL/uL — ABNORMAL LOW (ref 4.22–5.81)
RDW: 13.9 % (ref 11.5–15.5)
WBC: 4.9 10*3/uL (ref 4.0–10.5)
nRBC: 0 % (ref 0.0–0.2)

## 2022-02-25 LAB — COMPREHENSIVE METABOLIC PANEL
ALT: 18 U/L (ref 0–44)
AST: 23 U/L (ref 15–41)
Albumin: 3.2 g/dL — ABNORMAL LOW (ref 3.5–5.0)
Alkaline Phosphatase: 55 U/L (ref 38–126)
Anion gap: 8 (ref 5–15)
BUN: <5 mg/dL — ABNORMAL LOW (ref 6–20)
CO2: 23 mmol/L (ref 22–32)
Calcium: 8.3 mg/dL — ABNORMAL LOW (ref 8.9–10.3)
Chloride: 109 mmol/L (ref 98–111)
Creatinine, Ser: 0.77 mg/dL (ref 0.61–1.24)
GFR, Estimated: >60 mL/min (ref >60)
Glucose, Bld: 117 mg/dL — ABNORMAL HIGH (ref 70–99)
Potassium: 3.2 mmol/L — ABNORMAL LOW (ref 3.5–5.1)
Sodium: 140 mmol/L (ref 135–145)
Total Bilirubin: 0.5 mg/dL (ref 0.3–1.2)
Total Protein: 6.5 g/dL (ref 6.5–8.1)

## 2022-02-25 LAB — PROTIME-INR
INR: 1.2 (ref 0.8–1.2)
Prothrombin Time: 14.9 seconds (ref 11.4–15.2)

## 2022-02-25 LAB — AMMONIA: Ammonia: 34 umol/L (ref 9–35)

## 2022-02-25 MED ORDER — PROCHLORPERAZINE EDISYLATE 10 MG/2ML IJ SOLN
10.0000 mg | Freq: Once | INTRAMUSCULAR | Status: AC
  Administered 2022-02-25: 10 mg via INTRAVENOUS
  Filled 2022-02-25: qty 2

## 2022-02-25 MED ORDER — SODIUM CHLORIDE 0.9 % IV BOLUS
1000.0000 mL | Freq: Once | INTRAVENOUS | Status: AC
  Administered 2022-02-25: 1000 mL via INTRAVENOUS

## 2022-02-25 MED ORDER — DIPHENHYDRAMINE HCL 50 MG/ML IJ SOLN
25.0000 mg | Freq: Once | INTRAMUSCULAR | Status: AC
  Administered 2022-02-25: 25 mg via INTRAVENOUS
  Filled 2022-02-25: qty 1
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
## 2022-02-25 NOTE — ED Triage Notes (Signed)
Pt states he has a cyst on his brain that he was suppose to get taken care of in Nov and did not but then 2 days ago noticed a knot on his forehead. Since then the knot has disappeared but someone told him to come get checked.  ?

## 2022-02-25 NOTE — Discharge Instructions (Signed)
Your CT head appeared unchanged from prior with the persistent cyst but no evidence of pressure buildup called hydrocephalus.  Your labs were overall reassuring and as your headache is resolved we feel you are safe for discharge home.  Please follow-up with the neurosurgery team for further management.  If any symptoms change or worsen or recur, please return to the nearest emergency department. ?

## 2022-02-25 NOTE — ED Notes (Signed)
Pt tolerated fluids, PO challenge passed.  ?

## 2022-02-25 NOTE — ED Notes (Signed)
Patient transported to CT 

## 2022-03-30 ENCOUNTER — Emergency Department (HOSPITAL_COMMUNITY)
Admission: EM | Admit: 2022-03-30 | Discharge: 2022-03-30 | Disposition: A | Discharge status: Home / Self Care | Payer: 59 | Attending: Emergency Medicine | Admitting: Emergency Medicine

## 2022-03-30 DIAGNOSIS — K029 Dental caries, unspecified: Secondary | ICD-10-CM | POA: Insufficient documentation

## 2022-03-30 DIAGNOSIS — K0889 Other specified disorders of teeth and supporting structures: Secondary | ICD-10-CM | POA: Diagnosis present

## 2022-03-30 MED ORDER — ACETAMINOPHEN 500 MG PO TABS
1000.0000 mg | ORAL_TABLET | Freq: Four times a day (QID) | ORAL | Status: DC | PRN
  Administered 2022-03-30: 1000 mg via ORAL

## 2022-03-30 MED ORDER — CLINDAMYCIN HCL 300 MG PO CAPS: 300.0000 mg | ORAL_CAPSULE | Freq: Three times a day (TID) | ORAL | 0 refills | Status: AC

## 2022-03-30 NOTE — ED Notes (Signed)
Patient verbalizes understanding of discharge instructions. Opportunity for questioning and answers were provided. Pt discharged from ED. 

## 2022-03-30 NOTE — ED Provider Notes (Signed)
?  [facility] ?Provider Note ? ? ?CSN: [id number] ?Arrival date & time: 03/30/22  [id number] ? ?  ? ?History ? ?Chief Complaint  ?Patient presents with  ? Oral Swelling  ? Dental Pain  ? ? ?Christopher Moon is a 46 y.o. male. ? ?Patient with pain in his mouth.  States that multiple of his teeth are rotted.  Denies any fevers or chills.  Denies any difficulty eating or drinking.  Denies any drainage or difficulty opening his mouth. ? ?The history is provided by the patient.  ?Dental Pain ? ?  ? ?Home Medications ?Prior to Admission medications   ?Medication Sig Start Date End Date Taking? Authorizing Provider  ?clindamycin (CLEOCIN) 300 MG capsule Take 1 capsule (300 mg total) by mouth 3 (three) times daily for 10 days. 03/30/22 04/09/22 Yes Genelle Economou, DO  ?   ? ?Allergies    ?Keflex [cephalexin]   ? ?Review of Systems   ?Review of Systems ? ?Physical Exam ?Updated Vital Signs ?BP 132/84 (BP Location: Right Arm)   Pulse 79   Temp 98.2 ?F (36.8 ?C) (Oral)   Resp 20   SpO2 98%  ?Physical Exam ?HENT:  ?   Head: Normocephalic.  ?   Comments: Dental caries throughout with bad gingival disease but no abscess, no trismus, no drooling, no submandibular swelling ? ? ?ED Results / Procedures / Treatments   ?Labs ?(all labs ordered are listed, but only abnormal results are displayed) ?Labs Reviewed - No data to display ? ?EKG ?None ? ?Radiology ?No results found. ? ?Procedures ?Procedures  ? ? ?Medications Ordered in ED ?Medications - No data to display ? ?ED Course/ Medical Decision Making/ A&P ?  ?                        ?Medical Decision Making ?Risk ?Prescription drug management. ? ? ?Patient is here with dental pain.  He has multiple dental caries on exam.  There is no signs of deep space infection.  Overall needs to see a dentist.  Will prescribe antibiotics.  Discharged in good condition. ? ?This chart was dictated using voice recognition software.  Despite best efforts to proofread,   errors can occur which can change the documentation meaning.  ? ? ? ? ? ? ? ?Final Clinical Impression(s) / ED Diagnoses ?Final diagnoses:  ?Pain due to dental caries  ? ? ?Rx / DC Orders ?ED Discharge Orders   ? ?      Ordered  ?  clindamycin (CLEOCIN) 300 MG capsule  3 times daily       ? 03/30/22 0825  ? ?  ?  ? ?  ? ? ?  ?Lennice Sites, DO ?03/30/22 [id number] ? ?

## 2022-03-30 NOTE — Discharge Instructions (Signed)
Call Dr. Mia Creek make an appointment.  You have also been given another list of resources for dental clinics.  Take antibiotics as prescribed. ?

## 2022-03-30 NOTE — ED Triage Notes (Signed)
Pt. Stated, My mouth is rotting, I have bad teeth and its all throughout my mouth, started 3 days ago ?

## 2022-07-17 IMAGING — CT CT HEAD W/O CM
4 series · 16 of 47 positions shown, 18 images · non-contrast
Comparison: 10/12/2021

CLINICAL DATA: 45-year-old male with a history mental status change



[Series 2: head wo · axial · 0.46mm/px · z∈[-169,-54]mm · 7 of 31 slices shown, 9 images]
[im 4/31  brain]
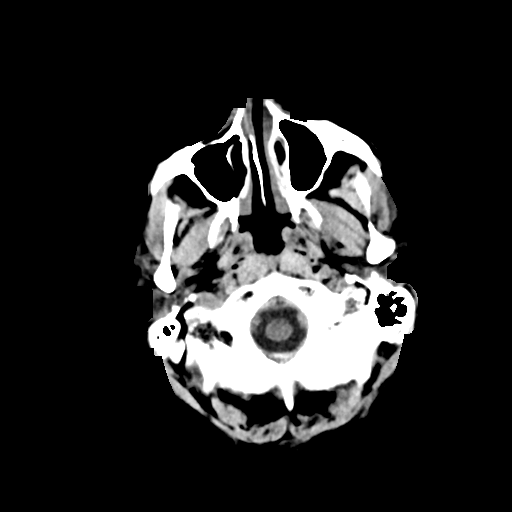
[im 4/31  bone]
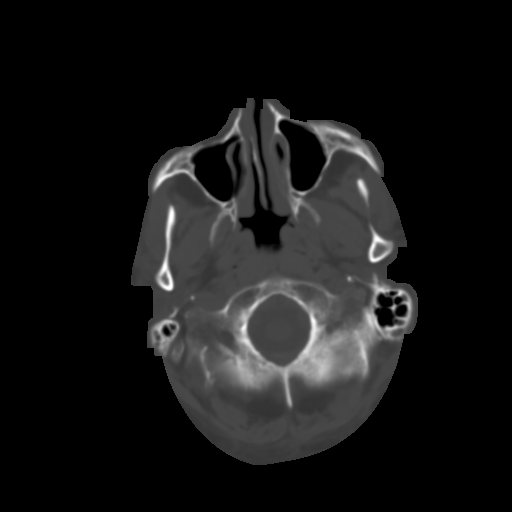
[im 8/31  brain]
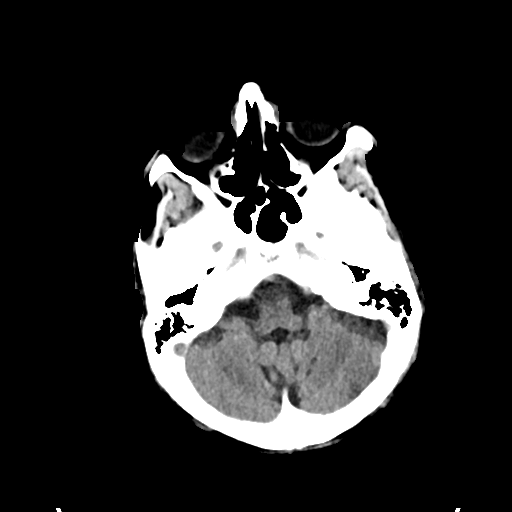
[im 12/31  brain]
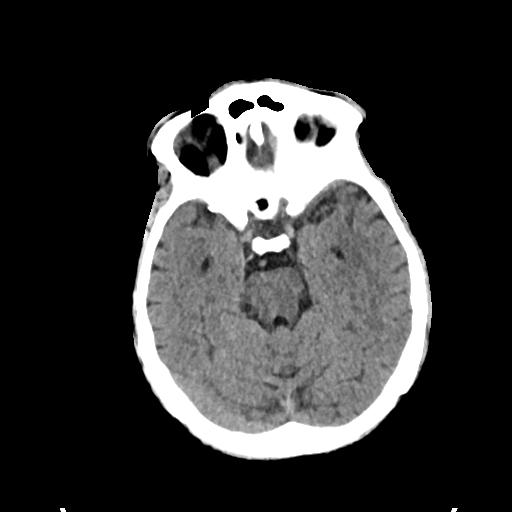
[im 16/31  brain]
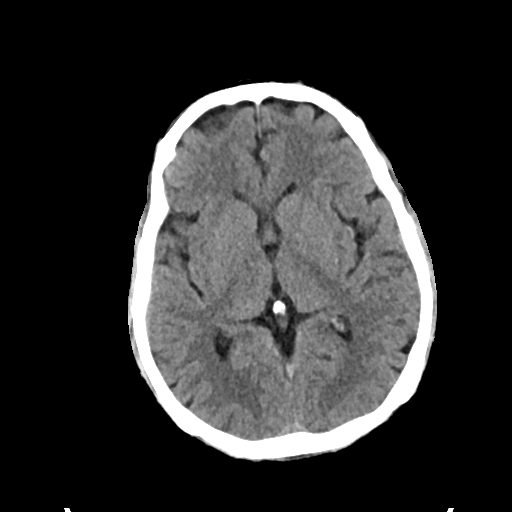
[im 19/31  brain]
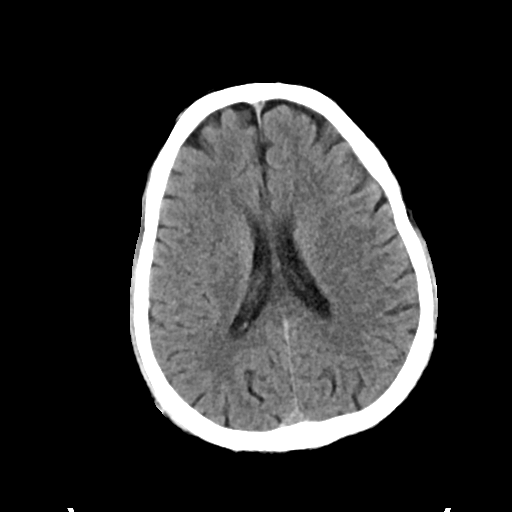
[im 19/31  bone]
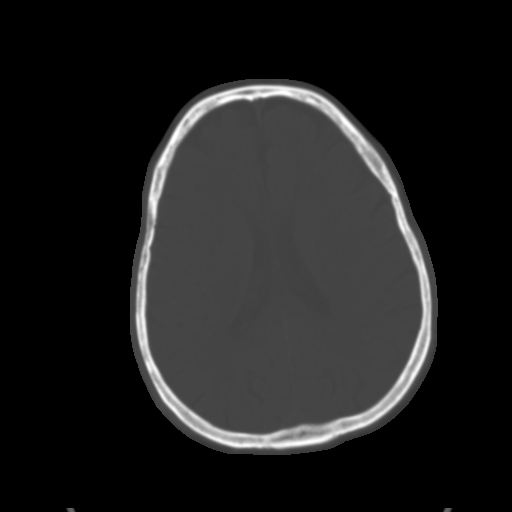
[im 23/31  brain]
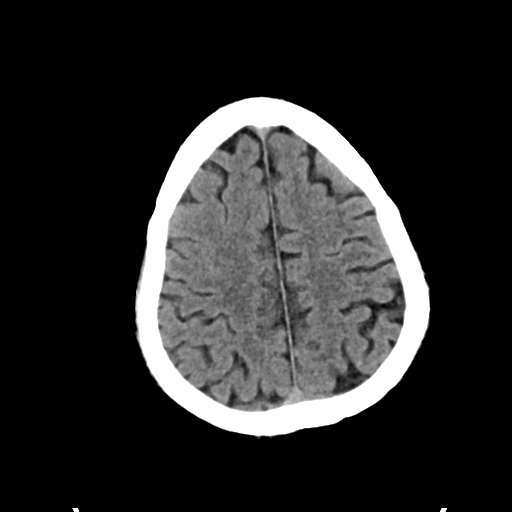
[im 27/31  brain]
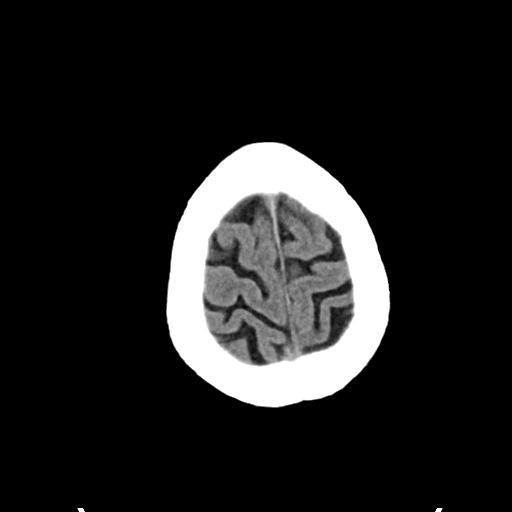

[Series 3: head bone · axial · 0.46mm/px · z∈[-170,-138]mm · 3 of 78 slices shown]
[im 8/78  bone]
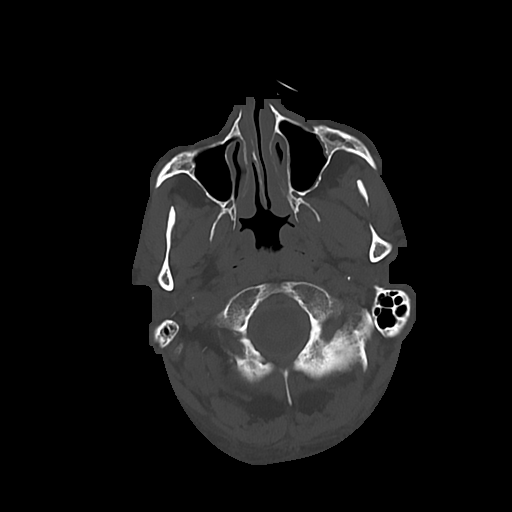
[im 16/78  bone]
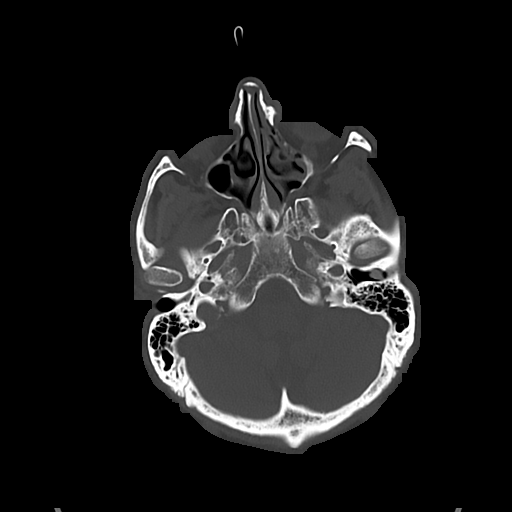
[im 24/78  bone]
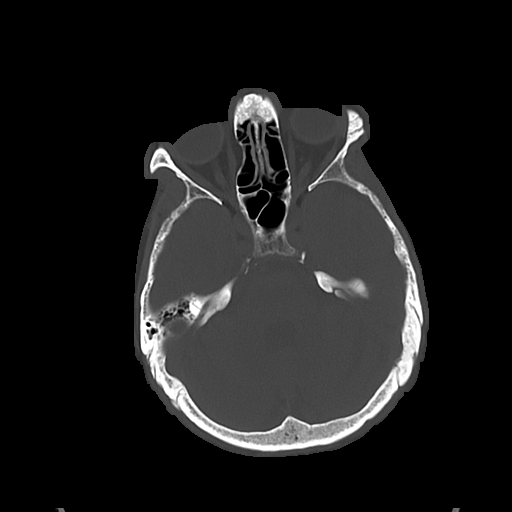

[Series 4: cor soft · coronal · 0.27mm/px · 3 of 67 slices shown]
[im 23/67  brain]
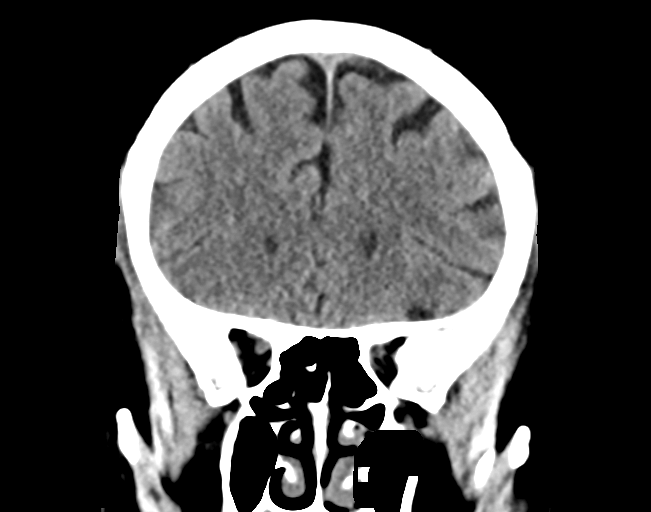
[im 30/67  brain]
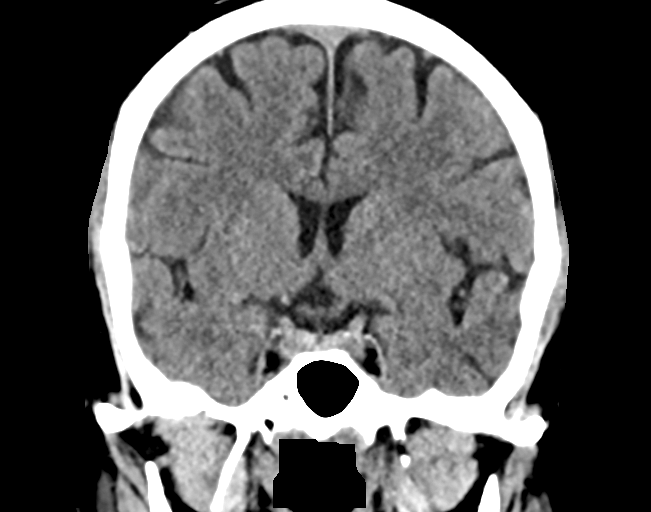
[im 37/67  brain]
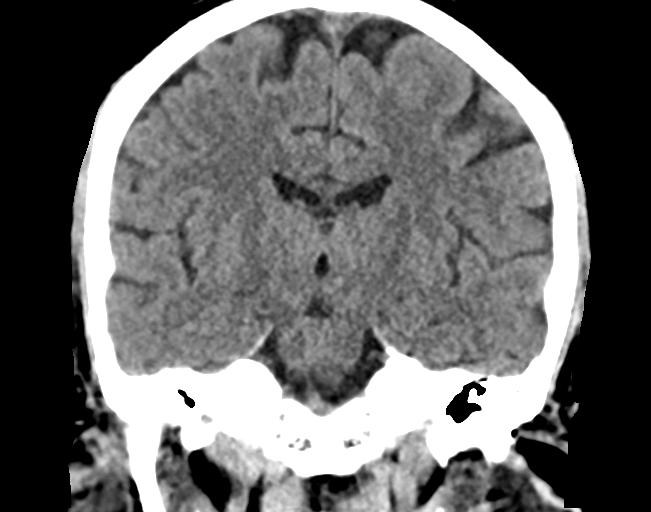

[Series 5: sag soft · sagittal · 0.35mm/px · 3 of 60 slices shown]
[im 20/60  brain]
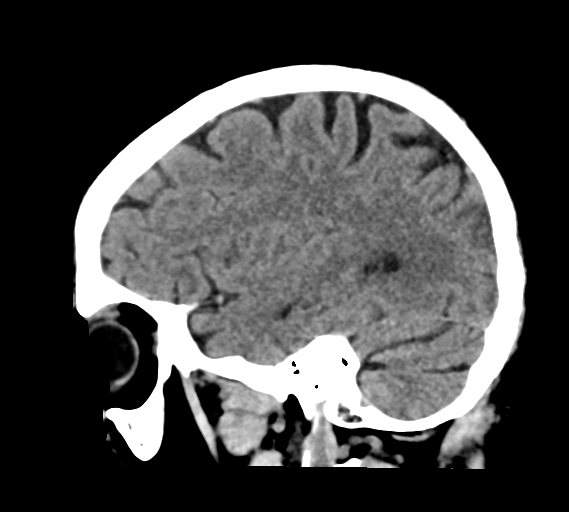
[im 30/60  brain]
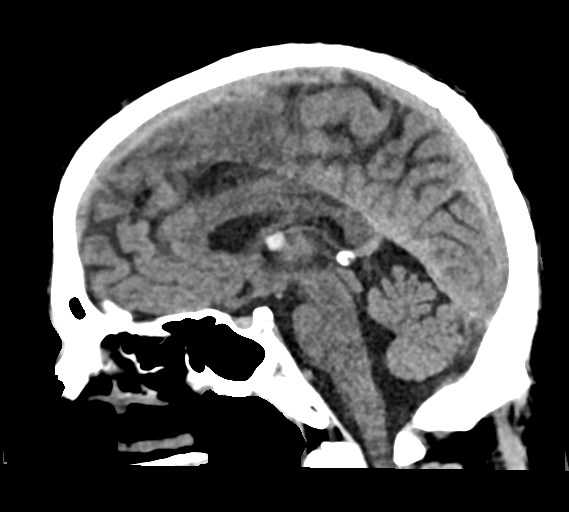
[im 40/60  brain]
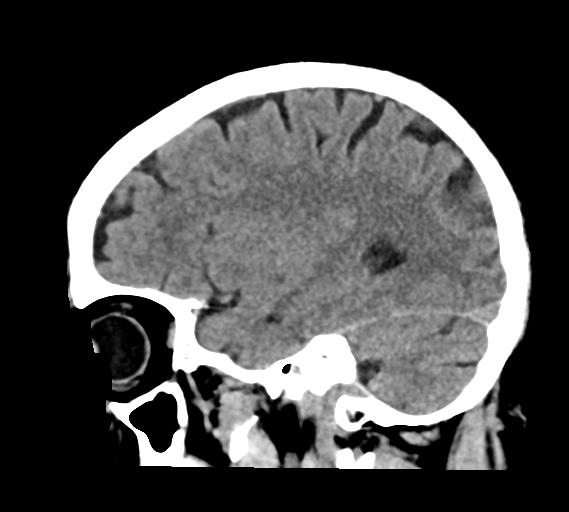

[16 of 47 positions shown; findings below may reference images not displayed]

FINDINGS: Brain: No acute intracranial hemorrhage. No midline shift or mass
effect. Gray-white differentiation maintained. Unremarkable
appearance of the ventricular system.

Redemonstration of colloid cyst at the foramen of Danilo Antonio, 6 mm-7 mm
similar to prior.

Vascular: Unremarkable.

Skull: No acute fracture.  No aggressive bone lesion identified.

Sinuses/Orbits: Unremarkable appearance of the orbits. Mastoid air
cells clear. No middle ear effusion. No significant sinus disease.

Other: None
IMPRESSION: Negative for acute finding.

Redemonstration of 6 mm-7 mm colloid cyst at the foramen of Danilo Antonio.
Again, referral for neuro surgery consultation is indicated, if not
already performed.

## 2022-07-29 ENCOUNTER — Other Ambulatory Visit: Payer: Self-pay

## 2022-07-29 ENCOUNTER — Emergency Department (HOSPITAL_COMMUNITY)
Admission: EM | Admit: 2022-07-29 | Discharge: 2022-07-29 | Discharge status: Against Medical Advice | Payer: 59 | Attending: Emergency Medicine | Admitting: Emergency Medicine

## 2022-07-29 DIAGNOSIS — F1010 Alcohol abuse, uncomplicated: Secondary | ICD-10-CM | POA: Insufficient documentation

## 2022-07-29 DIAGNOSIS — Z5321 Procedure and treatment not carried out due to patient leaving prior to being seen by health care provider: Secondary | ICD-10-CM | POA: Insufficient documentation

## 2022-07-29 DIAGNOSIS — Y907 Blood alcohol level of 200-239 mg/100 ml: Secondary | ICD-10-CM | POA: Insufficient documentation

## 2022-07-29 DIAGNOSIS — R55 Syncope and collapse: Secondary | ICD-10-CM | POA: Insufficient documentation

## 2022-07-29 LAB — CBC WITH DIFFERENTIAL/PLATELET
Abs Immature Granulocytes: 0.01 10*3/uL (ref 0.00–0.07)
Basophils Absolute: 0.1 10*3/uL (ref 0.0–0.1)
Basophils Relative: 1 %
Eosinophils Absolute: 0.1 10*3/uL (ref 0.0–0.5)
Eosinophils Relative: 2 %
HCT: 37 % — ABNORMAL LOW (ref 39.0–52.0)
Hemoglobin: 12.6 g/dL — ABNORMAL LOW (ref 13.0–17.0)
Immature Granulocytes: 0 %
Lymphocytes Relative: 38 %
Lymphs Abs: 1.6 10*3/uL (ref 0.7–4.0)
MCH: 30.1 pg (ref 26.0–34.0)
MCHC: 34.1 g/dL (ref 30.0–36.0)
MCV: 88.3 fL (ref 80.0–100.0)
Monocytes Absolute: 0.3 10*3/uL (ref 0.1–1.0)
Monocytes Relative: 8 %
Neutro Abs: 2.1 10*3/uL (ref 1.7–7.7)
Neutrophils Relative %: 51 %
Platelets: 197 10*3/uL (ref 150–400)
RBC: 4.19 MIL/uL — ABNORMAL LOW (ref 4.22–5.81)
RDW: 13.7 % (ref 11.5–15.5)
WBC: 4.2 10*3/uL (ref 4.0–10.5)
nRBC: 0 % (ref 0.0–0.2)

## 2022-07-29 LAB — ETHANOL: Alcohol, Ethyl (B): 233 mg/dL — ABNORMAL HIGH (ref <10)

## 2022-07-29 LAB — BASIC METABOLIC PANEL
Anion gap: 7 (ref 5–15)
BUN: 5 mg/dL — ABNORMAL LOW (ref 6–20)
CO2: 25 mmol/L (ref 22–32)
Calcium: 8.4 mg/dL — ABNORMAL LOW (ref 8.9–10.3)
Chloride: 105 mmol/L (ref 98–111)
Creatinine, Ser: 0.84 mg/dL (ref 0.61–1.24)
GFR, Estimated: >60 mL/min (ref >60)
Glucose, Bld: 100 mg/dL — ABNORMAL HIGH (ref 70–99)
Potassium: 3.8 mmol/L (ref 3.5–5.1)
Sodium: 137 mmol/L (ref 135–145)
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
## 2022-07-29 NOTE — ED Triage Notes (Signed)
Pt here from [facility] via [facility] for syncope. Pt is homeless and reports he has been drinking alcohol and has had multiple syncopal episodes, no falls. Pt reports he just keeps "passing out". 112/64, 64HR, cbg 104, 98% RA. 18g posterior L FA. Ems gave NS.

## 2022-07-29 NOTE — ED Notes (Addendum)
RN asked pt what brought him to the ER, pt replied "I aint got nowhere to go so I went to [facility] to sit down and they said I passed out". Pt unable to answer further questions, pt states he does not remember it, reports he has been "drinking alcohol like its water".

## 2022-07-29 NOTE — ED Provider Triage Note (Signed)
Emergency Medicine Provider Triage Evaluation Note  Christopher Moon , a 46 y.o. male  was evaluated in triage.  Pt complains of LOC onset prior to arrival.  Brought in by EMS.  Patient is homeless.  He was in [employer] sitting when he had a syncopal episode.  Denies falling from a seated position or hitting his head.  Denies abdominal pain, nausea, vomiting.   Review of Systems  Positive: As per HPI Negative:   Physical Exam  BP 116/87 (BP Location: Right Arm)   Pulse 74   Temp 98 F (36.7 C) (Oral)   Resp 16   SpO2 99%  Gen:   Awake, no distress   Resp:  Normal effort  MSK:   Moves extremities without difficulty  Other:    Medical Decision Making  Medically screening exam initiated at 3:49 PM.  Appropriate orders placed.  Christopher Moon was informed that the remainder of the evaluation will be completed by another provider, this initial triage assessment does not replace that evaluation, and the importance of remaining in the ED until their evaluation is complete.  Work-up initiated   Uriel Horkey A, PA-C 07/29/22 1556

## 2023-01-19 ENCOUNTER — Emergency Department (HOSPITAL_COMMUNITY): Payer: BLUE CROSS/BLUE SHIELD

## 2023-01-19 ENCOUNTER — Emergency Department (HOSPITAL_COMMUNITY)
Admission: EM | Admit: 2023-01-19 | Discharge: 2023-01-19 | Disposition: A | Discharge status: Home / Self Care | Payer: BLUE CROSS/BLUE SHIELD | Attending: Emergency Medicine | Admitting: Emergency Medicine

## 2023-01-19 ENCOUNTER — Other Ambulatory Visit: Payer: Self-pay

## 2023-01-19 DIAGNOSIS — K047 Periapical abscess without sinus: Secondary | ICD-10-CM | POA: Insufficient documentation

## 2023-01-19 DIAGNOSIS — J029 Acute pharyngitis, unspecified: Secondary | ICD-10-CM | POA: Diagnosis present

## 2023-01-19 LAB — BASIC METABOLIC PANEL
Anion gap: 8 (ref 5–15)
BUN: 6 mg/dL (ref 6–20)
CO2: 26 mmol/L (ref 22–32)
Calcium: 8.2 mg/dL — ABNORMAL LOW (ref 8.9–10.3)
Chloride: 105 mmol/L (ref 98–111)
Creatinine, Ser: 0.85 mg/dL (ref 0.61–1.24)
GFR, Estimated: >60 mL/min (ref >60)
Glucose, Bld: 112 mg/dL — ABNORMAL HIGH (ref 70–99)
Potassium: 3.6 mmol/L (ref 3.5–5.1)
Sodium: 139 mmol/L (ref 135–145)

## 2023-01-19 LAB — GROUP A STREP BY PCR: Group A Strep by PCR: NOT DETECTED

## 2023-01-19 LAB — CBC WITH DIFFERENTIAL/PLATELET
Abs Immature Granulocytes: 0.02 10*3/uL (ref 0.00–0.07)
Basophils Absolute: 0.1 10*3/uL (ref 0.0–0.1)
Basophils Relative: 1 %
Eosinophils Absolute: 0.1 10*3/uL (ref 0.0–0.5)
Eosinophils Relative: 1 %
HCT: 36.7 % — ABNORMAL LOW (ref 39.0–52.0)
Hemoglobin: 11.9 g/dL — ABNORMAL LOW (ref 13.0–17.0)
Immature Granulocytes: 0 %
Lymphocytes Relative: 30 %
Lymphs Abs: 1.9 10*3/uL (ref 0.7–4.0)
MCH: 29.5 pg (ref 26.0–34.0)
MCHC: 32.4 g/dL (ref 30.0–36.0)
MCV: 90.8 fL (ref 80.0–100.0)
Monocytes Absolute: 0.4 10*3/uL (ref 0.1–1.0)
Monocytes Relative: 6 %
Neutro Abs: 4 10*3/uL (ref 1.7–7.7)
Neutrophils Relative %: 62 %
Platelets: 191 10*3/uL (ref 150–400)
RBC: 4.04 MIL/uL — ABNORMAL LOW (ref 4.22–5.81)
RDW: 14.6 % (ref 11.5–15.5)
WBC: 6.4 10*3/uL (ref 4.0–10.5)
nRBC: 0 % (ref 0.0–0.2)

## 2023-01-19 MED ORDER — IOHEXOL 350 MG/ML SOLN
75.0000 mL | Freq: Once | INTRAVENOUS | Status: AC | PRN
  Administered 2023-01-19: 75 mL via INTRAVENOUS

## 2023-01-19 MED ORDER — KETOROLAC TROMETHAMINE 30 MG/ML IJ SOLN
15.0000 mg | Freq: Once | INTRAMUSCULAR | Status: AC
  Administered 2023-01-19: 15 mg via INTRAVENOUS
  Filled 2023-01-19: qty 1

## 2023-01-19 MED ORDER — OXYCODONE-ACETAMINOPHEN 5-325 MG PO TABS: 1.0000 | ORAL_TABLET | Freq: Three times a day (TID) | ORAL | 0 refills | Status: AC | PRN

## 2023-01-19 MED ORDER — AMOXICILLIN-POT CLAVULANATE 875-125 MG PO TABS: 1.0000 | ORAL_TABLET | Freq: Two times a day (BID) | ORAL | 0 refills | Status: DC
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
## 2023-01-19 NOTE — ED Triage Notes (Signed)
Pt. Stated, I have a sore throat and can't hardly swallowing. Worse I ever had. Started yesterday

## 2023-01-19 NOTE — ED Notes (Signed)
I tried to do a COVID swab on pt and I informed pt I was going to swab his nose. I stuck the q-tip in nose and pt became irate and got and and stated I should have held his head and started screaming at me. I told pt he was not going to talk to me like that and he continued to get loud and stated I was the one that was ignorant. I left the room and called security on pt. Pt was still agitated and wouldn't cooperate at first and kept getting loud, but then decided to let me place the IV and obtain blood work.

## 2023-01-19 NOTE — ED Notes (Signed)
Left side of throat is swollen and erythematous

## 2023-01-28 ENCOUNTER — Emergency Department (HOSPITAL_COMMUNITY)
Admission: EM | Admit: 2023-01-28 | Discharge: 2023-01-28 | Disposition: A | Discharge status: Home / Self Care | Payer: BLUE CROSS/BLUE SHIELD | Attending: Emergency Medicine | Admitting: Emergency Medicine

## 2023-01-28 ENCOUNTER — Encounter (HOSPITAL_COMMUNITY): Payer: Self-pay

## 2023-01-28 ENCOUNTER — Other Ambulatory Visit: Payer: Self-pay

## 2023-01-28 DIAGNOSIS — S61219A Laceration without foreign body of unspecified finger without damage to nail, initial encounter: Secondary | ICD-10-CM

## 2023-01-28 DIAGNOSIS — S61220A Laceration with foreign body of right index finger without damage to nail, initial encounter: Secondary | ICD-10-CM | POA: Diagnosis present

## 2023-01-28 DIAGNOSIS — W260XXA Contact with knife, initial encounter: Secondary | ICD-10-CM | POA: Insufficient documentation

## 2023-01-28 DIAGNOSIS — Y93G1 Activity, food preparation and clean up: Secondary | ICD-10-CM | POA: Diagnosis not present

## 2023-01-28 MED ORDER — BACITRACIN ZINC 500 UNIT/GM EX OINT
TOPICAL_OINTMENT | Freq: Once | CUTANEOUS | Status: AC
  Administered 2023-01-28: 1 via TOPICAL

## 2023-01-28 NOTE — ED Provider Notes (Signed)
[facility] Provider Note   CSN: [id number] Arrival date & time: 01/28/23  [id number]     History  Chief Complaint  Patient presents with   finger lac    Christopher Moon is a 47 y.o. male.  HPI   Patient has a history of seizures.  He presents to the ED for evaluation of laceration to his finger.  Patient states he has an issue with dry hands.  Patient states he was washing dishes and cut the tip of his finger.  Patient states this occurred about 12 hours ago.  He has not noticed any bleeding since.  He was not sure if he needed stitches.  The injury was on the right fourth digit  Home Medications Prior to Admission medications   Medication Sig Start Date End Date Taking? Authorizing Provider  amoxicillin-clavulanate (AUGMENTIN) 875-125 MG tablet Take 1 tablet by mouth every 12 (twelve) hours. 01/19/23   Davonna Belling, MD  oxyCODONE-acetaminophen (PERCOCET/ROXICET) 5-325 MG tablet Take 1-2 tablets by mouth every 8 (eight) hours as needed for severe pain. 01/19/23   Davonna Belling, MD      Allergies    Keflex [cephalexin]    Review of Systems   Review of Systems  Physical Exam Updated Vital Signs BP 114/80 (BP Location: Right Arm)   Pulse 83   Temp 98 F (36.7 C) (Oral)   Resp 16   Ht 1.676 m ('5\' 6"'$ )   Wt 65.8 kg   SpO2 98%   BMI 23.41 kg/m  Physical Exam Vitals and nursing note reviewed.  Constitutional:      General: He is not in acute distress.    Appearance: He is well-developed.  HENT:     Head: Normocephalic and atraumatic.     Right Ear: External ear normal.     Left Ear: External ear normal.  Eyes:     General: No scleral icterus.       Right eye: No discharge.        Left eye: No discharge.     Conjunctiva/sclera: Conjunctivae normal.  Neck:     Trachea: No tracheal deviation.  Cardiovascular:     Rate and Rhythm: Normal rate.  Pulmonary:     Effort: Pulmonary effort is normal. No respiratory  distress.     Breath sounds: No stridor.  Abdominal:     General: There is no distension.  Musculoskeletal:        General: No swelling or deformity.     Cervical back: Neck supple.  Skin:    General: Skin is warm and dry.     Findings: No rash.     Comments: Patient has dry hands, some minor cracking of the skin around the cuticles noted, superficial laceration 5 mm in size at the distal aspect of his fingertip, no bleeding, wound edges are well-approximated  Neurological:     Mental Status: He is alert. Mental status is at baseline.     Cranial Nerves: No dysarthria or facial asymmetry.     Motor: No seizure activity.     ED Results / Procedures / Treatments   Labs (all labs ordered are listed, but only abnormal results are displayed) Labs Reviewed - No data to display  EKG None  Radiology No results found.  Procedures Procedures    Medications Ordered in ED Medications  bacitracin ointment (has no administration in time range)    ED Course/ Medical Decision Making/ A&P  Medical Decision Making Risk OTC drugs.   Patient has a superficial laceration on exam.  No indication for suturing or wound closure.  Patient does have dry hands and I think this may have contributed to this small wound.  Will have him apply antibiotic ointment and keep dressing to have it covered.  Recommended topical moisturizing agents for his hands as well as wearing protective gloves when he is outside working        Final Clinical Impression(s) / ED Diagnoses Final diagnoses:  Laceration of finger without foreign body without damage to nail, unspecified finger, unspecified laterality, initial encounter    Rx / DC Orders ED Discharge Orders     None         Dorie Rank, MD 01/28/23 [phone]

## 2023-01-28 NOTE — ED Triage Notes (Signed)
Pt states he was cleaning at home and he lacerated his finger on a knife about 12 hrs ago. Pt has a approx 0.5 in lac to right fourth digit of hand at tip of finger. The finger is not bleeding.

## 2023-01-28 NOTE — Discharge Instructions (Addendum)
Apply antibiotic ointment to the cut to help prevent infection.  Keep a bandage over your fingertip until the wound heals.  Consider wearing gloves when you are working.  Use an over-the-counter moisturizing cream such as Eucerin or CeraVe to help with your dry hands

## 2023-03-22 ENCOUNTER — Encounter (HOSPITAL_COMMUNITY): Payer: Self-pay

## 2023-03-22 ENCOUNTER — Emergency Department (HOSPITAL_COMMUNITY)
Admission: EM | Admit: 2023-03-22 | Discharge: 2023-03-22 | Disposition: A | Discharge status: Home / Self Care | Payer: Self-pay | Source: Home / Self Care

## 2023-03-22 ENCOUNTER — Other Ambulatory Visit: Payer: Self-pay

## 2023-03-22 ENCOUNTER — Emergency Department (HOSPITAL_COMMUNITY)
Admission: EM | Admit: 2023-03-22 | Discharge: 2023-03-22 | Payer: BLUE CROSS/BLUE SHIELD | Attending: Emergency Medicine | Admitting: Emergency Medicine

## 2023-03-22 ENCOUNTER — Emergency Department (HOSPITAL_COMMUNITY): Payer: BLUE CROSS/BLUE SHIELD

## 2023-03-22 DIAGNOSIS — X58XXXA Exposure to other specified factors, initial encounter: Secondary | ICD-10-CM | POA: Insufficient documentation

## 2023-03-22 DIAGNOSIS — S01411A Laceration without foreign body of right cheek and temporomandibular area, initial encounter: Secondary | ICD-10-CM | POA: Insufficient documentation

## 2023-03-22 DIAGNOSIS — S0181XA Laceration without foreign body of other part of head, initial encounter: Secondary | ICD-10-CM

## 2023-03-22 DIAGNOSIS — S0990XA Unspecified injury of head, initial encounter: Secondary | ICD-10-CM | POA: Diagnosis not present

## 2023-03-22 DIAGNOSIS — S0993XA Unspecified injury of face, initial encounter: Secondary | ICD-10-CM | POA: Diagnosis present

## 2023-03-22 MED ORDER — LIDOCAINE HCL (PF) 1 % IJ SOLN
5.0000 mL | Freq: Once | INTRAMUSCULAR | Status: AC
  Administered 2023-03-22: 5 mL
  Filled 2023-03-22: qty 30
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
## 2023-03-22 NOTE — Discharge Instructions (Addendum)
I have placed 3 stiches to the right side of your face, please have these removed within 5-7 days.  The CT of your Head was normal.

## 2023-03-22 NOTE — ED Notes (Signed)
Patient transported to CT 

## 2023-10-26 ENCOUNTER — Emergency Department (HOSPITAL_COMMUNITY)
Admission: EM | Admit: 2023-10-26 | Discharge: 2023-10-26 | Disposition: A | Discharge status: Home / Self Care | Payer: BLUE CROSS/BLUE SHIELD | Attending: Emergency Medicine | Admitting: Emergency Medicine

## 2023-10-26 ENCOUNTER — Other Ambulatory Visit: Payer: Self-pay

## 2023-10-26 ENCOUNTER — Encounter (HOSPITAL_COMMUNITY): Payer: Self-pay

## 2023-10-26 DIAGNOSIS — M79671 Pain in right foot: Secondary | ICD-10-CM | POA: Insufficient documentation

## 2023-10-26 DIAGNOSIS — R7309 Other abnormal glucose: Secondary | ICD-10-CM | POA: Diagnosis not present

## 2023-10-26 LAB — CBG MONITORING, ED: Glucose-Capillary: 102 mg/dL — ABNORMAL HIGH (ref 70–99)

## 2023-10-26 MED ORDER — DOXYCYCLINE HYCLATE 100 MG PO CAPS: 100.0000 mg | ORAL_CAPSULE | Freq: Two times a day (BID) | ORAL | 0 refills | Status: DC

## 2023-10-26 NOTE — Discharge Instructions (Signed)
Follow-up with the primary care clinic listed above.  Take antibiotics.  Take Tylenol as needed for pain.

## 2023-10-26 NOTE — ED Provider Notes (Signed)
[place] EMERGENCY DEPARTMENT AT [facility] Provider Note   CSN: [id number] Arrival date & time: 10/26/23  1216     History  Chief Complaint  Patient presents with   Foot Pain    Christopher Moon is a 47 y.o. male.  47 year old male presents today for concern of bilateral foot pain but worse on the right.  Particularly on the right great toe.  He states he had some bleeding and some skin changes.  He states this all started prior to him being incarcerated for about 120 days, but worsened during the incarceration.  Does not have a PCP.  Denies other complaints.  No injury to his feet.  The history is provided by the patient. No language interpreter was used.       Home Medications Prior to Admission medications   Medication Sig Start Date End Date Taking? Authorizing Provider  doxycycline (VIBRAMYCIN) 100 MG capsule Take 1 capsule (100 mg total) by mouth 2 (two) times daily. 10/26/23  Yes Karie Mainland, Kayda Allers, PA-C  oxyCODONE-acetaminophen (PERCOCET/ROXICET) 5-325 MG tablet Take 1-2 tablets by mouth every 8 (eight) hours as needed for severe pain. 01/19/23   Benjiman Core, MD      Allergies    Keflex [cephalexin]    Review of Systems   Review of Systems  Constitutional:  Negative for chills and fever.  Skin:  Negative for wound.  All other systems reviewed and are negative.   Physical Exam Updated Vital Signs BP (!) 125/94   Pulse (!) 105   Temp 98.8 F (37.1 C) (Oral)   Resp 16   Ht 5\' 6"  (1.676 m)   Wt 74.8 kg   SpO2 98%   BMI 26.63 kg/m  Physical Exam Vitals and nursing note reviewed.  Constitutional:      General: He is not in acute distress.    Appearance: Normal appearance. He is not ill-appearing.  HENT:     Head: Normocephalic and atraumatic.     Nose: Nose normal.  Eyes:     Conjunctiva/sclera: Conjunctivae normal.  Cardiovascular:     Rate and Rhythm: Normal rate.     Comments: Initially noted to be tachycardic but rate controlled on  my eval. Pulmonary:     Effort: Pulmonary effort is normal. No respiratory distress.  Musculoskeletal:        General: No deformity.  Skin:    Findings: No rash.  Neurological:     Mental Status: He is alert.     ED Results / Procedures / Treatments   Labs (all labs ordered are listed, but only abnormal results are displayed) Labs Reviewed  CBG MONITORING, ED - Abnormal; Notable for the following components:      Result Value   Glucose-Capillary 102 (*)    All other components within normal limits    EKG None  Radiology No results found.  Procedures Procedures    Medications Ordered in ED Medications - No data to display  ED Course/ Medical Decision Making/ A&P                                 Medical Decision Making Risk Prescription drug management.   47 year old male presents today for concern of bilateral foot pain.  No visible deformity, wound noted.  Good range of motion to bilateral ankles with little toes.  He is ambulating without difficulty.  Neurovascularly intact.  Some skin changes noted to right  great toe.  Consistent with fungal infection.  He would like to trial antibiotics.  Will give antibiotic course and referral to PCP.  Discussed strict return precaution.  Given the neuropathy will check CBG.  CBG 102.  Discharged in stable condition.  Return precaution discussed.  No indication for imaging given no injury and no tenderness palpation.   Final Clinical Impression(s) / ED Diagnoses Final diagnoses:  Foot pain, right    Rx / DC Orders ED Discharge Orders          Ordered    doxycycline (VIBRAMYCIN) 100 MG capsule  2 times daily        10/26/23 1324              Marita Kansas, PA-C 10/26/23 1335    Franne Forts, DO 11/04/23 1526

## 2023-10-26 NOTE — ED Triage Notes (Addendum)
Pt c/o bilateral foot pain for 4 months. Pt states he has intermittent swelling and bleeding under right great toenail. Pain is worse when walking. Pt states he thinks the pain is coming from walking on cement, denies any other injury.

## 2023-10-26 NOTE — ED Notes (Signed)
ED PA at bedside

## 2024-06-16 ENCOUNTER — Encounter (HOSPITAL_COMMUNITY): Payer: Self-pay

## 2024-06-16 ENCOUNTER — Emergency Department (HOSPITAL_COMMUNITY)
Admission: EM | Admit: 2024-06-16 | Discharge: 2024-06-16 | Disposition: A | Discharge status: Home / Self Care | Attending: Emergency Medicine | Admitting: Emergency Medicine

## 2024-06-16 ENCOUNTER — Other Ambulatory Visit: Payer: Self-pay

## 2024-06-16 ENCOUNTER — Emergency Department (HOSPITAL_COMMUNITY)

## 2024-06-16 DIAGNOSIS — D649 Anemia, unspecified: Secondary | ICD-10-CM | POA: Insufficient documentation

## 2024-06-16 DIAGNOSIS — R112 Nausea with vomiting, unspecified: Secondary | ICD-10-CM | POA: Diagnosis present

## 2024-06-16 DIAGNOSIS — K760 Fatty (change of) liver, not elsewhere classified: Secondary | ICD-10-CM | POA: Diagnosis not present

## 2024-06-16 LAB — CBC WITH DIFFERENTIAL/PLATELET
Abs Immature Granulocytes: 0.02 K/uL (ref 0.00–0.07)
Basophils Absolute: 0 K/uL (ref 0.0–0.1)
Basophils Relative: 1 %
Eosinophils Absolute: 0.1 K/uL (ref 0.0–0.5)
Eosinophils Relative: 1 %
HCT: 38.2 % — ABNORMAL LOW (ref 39.0–52.0)
Hemoglobin: 12.8 g/dL — ABNORMAL LOW (ref 13.0–17.0)
Immature Granulocytes: 0 %
Lymphocytes Relative: 24 %
Lymphs Abs: 1.4 K/uL (ref 0.7–4.0)
MCH: 30.8 pg (ref 26.0–34.0)
MCHC: 33.5 g/dL (ref 30.0–36.0)
MCV: 92 fL (ref 80.0–100.0)
Monocytes Absolute: 0.5 K/uL (ref 0.1–1.0)
Monocytes Relative: 9 %
Neutro Abs: 3.7 K/uL (ref 1.7–7.7)
Neutrophils Relative %: 65 %
Platelets: 214 K/uL (ref 150–400)
RBC: 4.15 MIL/uL — ABNORMAL LOW (ref 4.22–5.81)
RDW: 13.8 % (ref 11.5–15.5)
WBC: 5.8 K/uL (ref 4.0–10.5)
nRBC: 0 % (ref 0.0–0.2)

## 2024-06-16 LAB — RAPID URINE DRUG SCREEN, HOSP PERFORMED
Amphetamines: POSITIVE — AB
Barbiturates: NOT DETECTED
Benzodiazepines: NOT DETECTED
Cocaine: POSITIVE — AB
Opiates: NOT DETECTED
Tetrahydrocannabinol: POSITIVE — AB

## 2024-06-16 LAB — COMPREHENSIVE METABOLIC PANEL WITH GFR
ALT: 53 U/L — ABNORMAL HIGH (ref 0–44)
AST: 50 U/L — ABNORMAL HIGH (ref 15–41)
Albumin: 3.6 g/dL (ref 3.5–5.0)
Alkaline Phosphatase: 63 U/L (ref 38–126)
Anion gap: 9 (ref 5–15)
BUN: 8 mg/dL (ref 6–20)
CO2: 23 mmol/L (ref 22–32)
Calcium: 8.7 mg/dL — ABNORMAL LOW (ref 8.9–10.3)
Chloride: 103 mmol/L (ref 98–111)
Creatinine, Ser: 0.82 mg/dL (ref 0.61–1.24)
GFR, Estimated: >60 mL/min (ref >60)
Glucose, Bld: 115 mg/dL — ABNORMAL HIGH (ref 70–99)
Potassium: 3.7 mmol/L (ref 3.5–5.1)
Sodium: 135 mmol/L (ref 135–145)
Total Bilirubin: 1 mg/dL (ref 0.0–1.2)
Total Protein: 7 g/dL (ref 6.5–8.1)

## 2024-06-16 LAB — LIPASE, BLOOD: Lipase: 28 U/L (ref 11–51)

## 2024-06-16 LAB — TROPONIN I (HIGH SENSITIVITY)
Troponin I (High Sensitivity): 4 ng/L (ref <18)
Troponin I (High Sensitivity): 4 ng/L (ref <18)

## 2024-06-16 MED ORDER — SODIUM CHLORIDE 0.9 % IV BOLUS
1000.0000 mL | Freq: Once | INTRAVENOUS | Status: AC
  Administered 2024-06-16: 1000 mL via INTRAVENOUS

## 2024-06-16 NOTE — ED Triage Notes (Signed)
 C/O vomiting and dizziness for 1 hr. Pt states he did marijuana, ETOH, and cocaine last night. EMS gave 4mg  of zofran . Axox4. VSS. Denies abd pain.
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
## 2024-06-16 NOTE — Discharge Instructions (Signed)
 Like we discussed, I would like for you to follow-up with our primary care service since you can get repeat labs and make sure your elevated liver enzymes do come down.  Please watch your diet.  I would strongly suggest you stop using cocaine and marijuana.  You can return to the emergency department for any worsening symptoms.

## 2024-07-18 ENCOUNTER — Encounter: Payer: Self-pay | Admitting: Family Medicine

## 2024-07-18 ENCOUNTER — Ambulatory Visit (INDEPENDENT_AMBULATORY_CARE_PROVIDER_SITE_OTHER): Admitting: Family Medicine

## 2024-07-18 VITALS — BP 111/75 | HR 73 | Ht 66.0 in | Wt 148.3 lb

## 2024-07-18 DIAGNOSIS — R899 Unspecified abnormal finding in specimens from other organs, systems and tissues: Secondary | ICD-10-CM

## 2024-07-18 DIAGNOSIS — Z720 Tobacco use: Secondary | ICD-10-CM | POA: Diagnosis not present

## 2024-07-18 DIAGNOSIS — Z7689 Persons encountering health services in other specified circumstances: Secondary | ICD-10-CM | POA: Diagnosis not present

## 2024-07-19 ENCOUNTER — Telehealth: Payer: Self-pay | Admitting: *Deleted

## 2024-07-22 ENCOUNTER — Encounter: Payer: Self-pay | Admitting: Family Medicine
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
## 2024-12-21 ENCOUNTER — Encounter (HOSPITAL_COMMUNITY): Payer: Self-pay

## 2024-12-21 ENCOUNTER — Emergency Department (HOSPITAL_COMMUNITY)

## 2024-12-21 ENCOUNTER — Emergency Department (HOSPITAL_COMMUNITY)
Admission: EM | Admit: 2024-12-21 | Discharge: 2024-12-21 | Disposition: A | Discharge status: Home / Self Care | Attending: Emergency Medicine | Admitting: Emergency Medicine

## 2024-12-21 ENCOUNTER — Other Ambulatory Visit: Payer: Self-pay

## 2024-12-21 DIAGNOSIS — Z5900 Homelessness unspecified: Secondary | ICD-10-CM | POA: Diagnosis not present

## 2024-12-21 DIAGNOSIS — F17200 Nicotine dependence, unspecified, uncomplicated: Secondary | ICD-10-CM | POA: Insufficient documentation

## 2024-12-21 DIAGNOSIS — M79662 Pain in left lower leg: Secondary | ICD-10-CM | POA: Insufficient documentation

## 2024-12-21 DIAGNOSIS — R519 Headache, unspecified: Secondary | ICD-10-CM | POA: Insufficient documentation

## 2024-12-21 DIAGNOSIS — M79661 Pain in right lower leg: Secondary | ICD-10-CM | POA: Insufficient documentation

## 2024-12-21 MED ORDER — ACETAMINOPHEN 500 MG PO TABS
1000.0000 mg | ORAL_TABLET | Freq: Once | ORAL | Status: AC
  Administered 2024-12-21: 1000 mg via ORAL
  Filled 2024-12-21: qty 2
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
## 2025-01-06 ENCOUNTER — Emergency Department (HOSPITAL_COMMUNITY)
Admission: EM | Admit: 2025-01-06 | Discharge: 2025-01-07 | Disposition: A | Attending: Emergency Medicine | Admitting: Emergency Medicine

## 2025-01-06 ENCOUNTER — Other Ambulatory Visit: Payer: Self-pay

## 2025-01-06 ENCOUNTER — Encounter (HOSPITAL_COMMUNITY): Payer: Self-pay

## 2025-01-06 DIAGNOSIS — S0285XA Fracture of orbit, unspecified, initial encounter for closed fracture: Secondary | ICD-10-CM

## 2025-01-06 DIAGNOSIS — S02832A Fracture of medial orbital wall, left side, initial encounter for closed fracture: Secondary | ICD-10-CM | POA: Insufficient documentation

## 2025-01-06 DIAGNOSIS — M25472 Effusion, left ankle: Secondary | ICD-10-CM | POA: Insufficient documentation

## 2025-01-06 DIAGNOSIS — S01411A Laceration without foreign body of right cheek and temporomandibular area, initial encounter: Secondary | ICD-10-CM | POA: Insufficient documentation

## 2025-01-06 DIAGNOSIS — Z23 Encounter for immunization: Secondary | ICD-10-CM | POA: Insufficient documentation
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
## 2025-01-07 ENCOUNTER — Emergency Department (HOSPITAL_COMMUNITY)

## 2025-01-07 ENCOUNTER — Encounter (HOSPITAL_COMMUNITY): Payer: Self-pay

## 2025-01-07 ENCOUNTER — Other Ambulatory Visit: Payer: Self-pay

## 2025-01-07 ENCOUNTER — Emergency Department (HOSPITAL_COMMUNITY)
Admission: EM | Admit: 2025-01-07 | Discharge: 2025-01-07 | Disposition: A | Discharge status: Home / Self Care | Source: Home / Self Care | Attending: Emergency Medicine | Admitting: Emergency Medicine

## 2025-01-07 DIAGNOSIS — W19XXXA Unspecified fall, initial encounter: Secondary | ICD-10-CM

## 2025-01-07 MED ORDER — LIDOCAINE-EPINEPHRINE-TETRACAINE (LET) TOPICAL GEL
3.0000 mL | Freq: Once | TOPICAL | Status: AC
  Administered 2025-01-07: 3 mL via TOPICAL
  Filled 2025-01-07: qty 3

## 2025-01-07 MED ORDER — IBUPROFEN 600 MG PO TABS: 600.0000 mg | ORAL_TABLET | Freq: Four times a day (QID) | ORAL | 0 refills | Status: AC | PRN

## 2025-01-07 MED ORDER — DOXYCYCLINE HYCLATE 100 MG PO CAPS: 100.0000 mg | ORAL_CAPSULE | Freq: Two times a day (BID) | ORAL | 0 refills | Status: AC

## 2025-01-07 MED ORDER — ACETAMINOPHEN 500 MG PO TABS
1000.0000 mg | ORAL_TABLET | Freq: Once | ORAL | Status: AC
  Administered 2025-01-07: 1000 mg via ORAL
  Filled 2025-01-07: qty 2

## 2025-01-07 MED ORDER — IBUPROFEN 600 MG PO TABS: 600.0000 mg | ORAL_TABLET | Freq: Four times a day (QID) | ORAL | 0 refills | Status: DC | PRN

## 2025-01-07 MED ORDER — DOXYCYCLINE HYCLATE 100 MG PO TABS
100.0000 mg | ORAL_TABLET | Freq: Once | ORAL | Status: AC
  Administered 2025-01-07: 100 mg via ORAL
  Filled 2025-01-07: qty 1

## 2025-01-07 MED ORDER — HYDROCODONE-ACETAMINOPHEN 5-325 MG PO TABS
1.0000 | ORAL_TABLET | Freq: Once | ORAL | Status: AC
  Administered 2025-01-07: 1 via ORAL
  Filled 2025-01-07: qty 1

## 2025-01-07 MED ORDER — DOXYCYCLINE HYCLATE 100 MG PO CAPS: 100.0000 mg | ORAL_CAPSULE | Freq: Two times a day (BID) | ORAL | 0 refills | Status: DC

## 2025-01-07 MED ORDER — TETANUS-DIPHTH-ACELL PERTUSSIS 5-2-15.5 LF-MCG/0.5 IM SUSP
0.5000 mL | Freq: Once | INTRAMUSCULAR | Status: AC
  Administered 2025-01-07: 0.5 mL via INTRAMUSCULAR
  Filled 2025-01-07: qty 0.5

## 2025-01-07 NOTE — Discharge Instructions (Signed)
 Evaluation for your fall is reassuring.  As we discussed earlier this morning please follow-up with ENT.  You can take Tylenol  and ibuprofen  for pain.
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
